# Patient Record
Sex: Male | Born: 1955 | Race: White | Hispanic: No | Marital: Married | Smoking: Former smoker
Health system: Southern US, Community
[De-identification: ages and names within clinical notes are randomized; demographics above are authoritative.]

## PROBLEM LIST (undated history)

## (undated) DIAGNOSIS — M1990 Unspecified osteoarthritis, unspecified site: Secondary | ICD-10-CM

## (undated) DIAGNOSIS — E785 Hyperlipidemia, unspecified: Secondary | ICD-10-CM

## (undated) DIAGNOSIS — R399 Unspecified symptoms and signs involving the genitourinary system: Secondary | ICD-10-CM

## (undated) DIAGNOSIS — Z7901 Long term (current) use of anticoagulants: Secondary | ICD-10-CM

## (undated) DIAGNOSIS — R351 Nocturia: Secondary | ICD-10-CM

## (undated) DIAGNOSIS — I255 Ischemic cardiomyopathy: Secondary | ICD-10-CM

## (undated) DIAGNOSIS — K219 Gastro-esophageal reflux disease without esophagitis: Secondary | ICD-10-CM

## (undated) DIAGNOSIS — I2510 Atherosclerotic heart disease of native coronary artery without angina pectoris: Secondary | ICD-10-CM

## (undated) DIAGNOSIS — N183 Chronic kidney disease, stage 3 unspecified: Secondary | ICD-10-CM

## (undated) DIAGNOSIS — E782 Mixed hyperlipidemia: Secondary | ICD-10-CM

## (undated) DIAGNOSIS — K08109 Complete loss of teeth, unspecified cause, unspecified class: Secondary | ICD-10-CM

## (undated) DIAGNOSIS — Z8711 Personal history of peptic ulcer disease: Secondary | ICD-10-CM

## (undated) DIAGNOSIS — Z972 Presence of dental prosthetic device (complete) (partial): Secondary | ICD-10-CM

## (undated) DIAGNOSIS — I10 Essential (primary) hypertension: Secondary | ICD-10-CM

## (undated) DIAGNOSIS — I213 ST elevation (STEMI) myocardial infarction of unspecified site: Secondary | ICD-10-CM

## (undated) DIAGNOSIS — C9110 Chronic lymphocytic leukemia of B-cell type not having achieved remission: Secondary | ICD-10-CM

## (undated) HISTORY — DX: ST elevation (STEMI) myocardial infarction of unspecified site: I21.3

## (undated) HISTORY — DX: Chronic lymphocytic leukemia of B-cell type not having achieved remission: C91.10

## (undated) HISTORY — DX: Hyperlipidemia, unspecified: E78.5

## (undated) HISTORY — DX: Ischemic cardiomyopathy: I25.5

## (undated) HISTORY — PX: TONSILLECTOMY: SUR1361

## (undated) HISTORY — DX: Atherosclerotic heart disease of native coronary artery without angina pectoris: I25.10

---

## 2008-08-07 ENCOUNTER — Ambulatory Visit: Payer: Self-pay | Admitting: Oncology

## 2008-08-08 ENCOUNTER — Other Ambulatory Visit: Admission: RE | Admit: 2008-08-08 | Discharge: 2008-08-08 | Payer: Self-pay | Admitting: Oncology

## 2008-08-08 ENCOUNTER — Encounter: Payer: Self-pay | Admitting: Oncology

## 2008-08-08 LAB — CMP (CANCER CENTER ONLY)
CO2: 27 mEq/L (ref 18–33)
Calcium: 9.9 mg/dL (ref 8.0–10.3)
Creat: 1.6 mg/dl — ABNORMAL HIGH (ref 0.6–1.2)
Glucose, Bld: 96 mg/dL (ref 73–118)
Total Bilirubin: 0.8 mg/dl (ref 0.20–1.60)

## 2008-08-08 LAB — MANUAL DIFFERENTIAL (CHCC SATELLITE)
ANC (CHCC HP manual diff): 2.5 10*3/uL (ref 1.5–6.5)
LYMPH: 87 % — ABNORMAL HIGH (ref 14–48)
PLT EST ~~LOC~~: ADEQUATE
Platelet Morphology: NORMAL
SEG: 6 % — ABNORMAL LOW (ref 40–75)

## 2008-08-08 LAB — CBC WITH DIFFERENTIAL (CANCER CENTER ONLY)
HCT: 48.7 % (ref 38.7–49.9)
HGB: 16.7 g/dL (ref 13.0–17.1)
MCH: 29.9 pg (ref 28.0–33.4)
MCV: 87 fL (ref 82–98)
RBC: 5.6 10*6/uL (ref 4.20–5.70)
WBC: 41.3 10*3/uL — ABNORMAL HIGH (ref 4.0–10.0)

## 2008-08-09 LAB — IGG, IGA, IGM
IgG (Immunoglobin G), Serum: 890 mg/dL (ref 694–1618)
IgM, Serum: 38 mg/dL — ABNORMAL LOW (ref 60–263)

## 2008-08-09 LAB — BETA 2 MICROGLOBULIN, SERUM: Beta-2 Microglobulin: 1.93 mg/L — ABNORMAL HIGH (ref 1.01–1.73)

## 2008-08-09 LAB — URIC ACID: Uric Acid, Serum: 8 mg/dL — ABNORMAL HIGH (ref 4.0–7.8)

## 2008-08-14 LAB — CBC WITH DIFFERENTIAL (CANCER CENTER ONLY)
BASO#: 1 10*3/uL — ABNORMAL HIGH (ref 0.0–0.2)
BASO%: 2.7 % — ABNORMAL HIGH (ref 0.0–2.0)
EOS%: 0.5 % (ref 0.0–7.0)
HCT: 46.1 % (ref 38.7–49.9)
HGB: 15.9 g/dL (ref 13.0–17.1)
LYMPH#: 32 10*3/uL — ABNORMAL HIGH (ref 0.9–3.3)
LYMPH%: 85.1 % — ABNORMAL HIGH (ref 14.0–48.0)
MCH: 29.8 pg (ref 28.0–33.4)
MCHC: 34.4 g/dL (ref 32.0–35.9)
MCV: 87 fL (ref 82–98)
NEUT%: 9.4 % — ABNORMAL LOW (ref 40.0–80.0)
RDW: 13 % (ref 10.5–14.6)

## 2008-08-19 LAB — FLOW CYTOMETRY - CHCC SATELLITE

## 2008-09-04 ENCOUNTER — Encounter: Payer: Self-pay | Admitting: Oncology

## 2008-09-04 ENCOUNTER — Other Ambulatory Visit: Admission: RE | Admit: 2008-09-04 | Discharge: 2008-09-04 | Payer: Self-pay | Admitting: Oncology

## 2008-09-04 LAB — MANUAL DIFFERENTIAL (CHCC SATELLITE)
ANC (CHCC HP manual diff): 4.5 10*3/uL (ref 1.5–6.5)
LYMPH: 81 % — ABNORMAL HIGH (ref 14–48)
MONO: 1 % (ref 0–13)
RBC Comments: NORMAL
SEG: 16 % — ABNORMAL LOW (ref 40–75)
Variant Lymph: 2 % — ABNORMAL HIGH (ref 0–0)

## 2008-09-04 LAB — CBC WITH DIFFERENTIAL (CANCER CENTER ONLY)
HCT: 44.3 % (ref 38.7–49.9)
HGB: 15.2 g/dL (ref 13.0–17.1)
MCHC: 34.4 g/dL (ref 32.0–35.9)
RBC: 5.07 10*6/uL (ref 4.20–5.70)
WBC: 28.1 10*3/uL — ABNORMAL HIGH (ref 4.0–10.0)

## 2008-09-13 LAB — ZAP-70 - CHCC SATELLITE

## 2008-10-17 ENCOUNTER — Ambulatory Visit: Payer: Self-pay | Admitting: Oncology

## 2008-10-21 LAB — CBC WITH DIFFERENTIAL (CANCER CENTER ONLY)
MCV: 87 fL (ref 82–98)
Platelets: 187 10*3/uL (ref 145–400)
RBC: 5.27 10*6/uL (ref 4.20–5.70)
RDW: 13 % (ref 10.5–14.6)
WBC: 36.4 10*3/uL — ABNORMAL HIGH (ref 4.0–10.0)

## 2008-10-21 LAB — MANUAL DIFFERENTIAL (CHCC SATELLITE)
ALC: 33.1 10*3/uL — ABNORMAL HIGH (ref 0.9–3.3)
ANC (CHCC HP manual diff): 2.9 10*3/uL (ref 1.5–6.5)
PLT EST ~~LOC~~: ADEQUATE
Platelet Morphology: NORMAL

## 2009-03-01 DIAGNOSIS — C9110 Chronic lymphocytic leukemia of B-cell type not having achieved remission: Secondary | ICD-10-CM

## 2009-03-01 HISTORY — DX: Chronic lymphocytic leukemia of B-cell type not having achieved remission: C91.10

## 2010-06-07 LAB — TISSUE HYBRIDIZATION (BONE MARROW)-NCBH

## 2010-06-07 LAB — BONE MARROW EXAM

## 2013-08-14 ENCOUNTER — Telehealth: Payer: Self-pay | Admitting: Hematology and Oncology

## 2013-08-14 NOTE — Telephone Encounter (Signed)
CALLED PATIENT TO SCHEDULE NP APPT PER PATIENT NOT ABLE TO SCHEDULE APPT DUE TO RETURNING BACK TO WORK AND WILL CALL BACK TO SCHEDULE.

## 2020-07-30 DIAGNOSIS — I2510 Atherosclerotic heart disease of native coronary artery without angina pectoris: Secondary | ICD-10-CM

## 2020-07-30 HISTORY — DX: Atherosclerotic heart disease of native coronary artery without angina pectoris: I25.10

## 2020-08-12 ENCOUNTER — Emergency Department (HOSPITAL_COMMUNITY): Payer: Managed Care, Other (non HMO)

## 2020-08-12 ENCOUNTER — Encounter (HOSPITAL_COMMUNITY)
Admission: EM | Disposition: A | Discharge status: Home / Self Care | Payer: Self-pay | Source: Home / Self Care | Attending: Cardiovascular Disease

## 2020-08-12 ENCOUNTER — Encounter (HOSPITAL_COMMUNITY): Payer: Self-pay

## 2020-08-12 ENCOUNTER — Inpatient Hospital Stay (HOSPITAL_COMMUNITY)
Admission: EM | Admit: 2020-08-12 | Discharge: 2020-08-14 | DRG: 246 | Disposition: A | Discharge status: Home / Self Care | Payer: Managed Care, Other (non HMO) | Attending: Cardiovascular Disease | Admitting: Cardiovascular Disease

## 2020-08-12 DIAGNOSIS — Z20822 Contact with and (suspected) exposure to covid-19: Secondary | ICD-10-CM | POA: Diagnosis present

## 2020-08-12 DIAGNOSIS — I10 Essential (primary) hypertension: Secondary | ICD-10-CM

## 2020-08-12 DIAGNOSIS — Z955 Presence of coronary angioplasty implant and graft: Secondary | ICD-10-CM

## 2020-08-12 DIAGNOSIS — E782 Mixed hyperlipidemia: Secondary | ICD-10-CM | POA: Diagnosis not present

## 2020-08-12 DIAGNOSIS — I110 Hypertensive heart disease with heart failure: Secondary | ICD-10-CM | POA: Diagnosis present

## 2020-08-12 DIAGNOSIS — I213 ST elevation (STEMI) myocardial infarction of unspecified site: Secondary | ICD-10-CM | POA: Diagnosis present

## 2020-08-12 DIAGNOSIS — I2510 Atherosclerotic heart disease of native coronary artery without angina pectoris: Secondary | ICD-10-CM | POA: Diagnosis not present

## 2020-08-12 DIAGNOSIS — I2102 ST elevation (STEMI) myocardial infarction involving left anterior descending coronary artery: Principal | ICD-10-CM | POA: Diagnosis present

## 2020-08-12 DIAGNOSIS — Z7982 Long term (current) use of aspirin: Secondary | ICD-10-CM | POA: Diagnosis not present

## 2020-08-12 DIAGNOSIS — C9110 Chronic lymphocytic leukemia of B-cell type not having achieved remission: Secondary | ICD-10-CM | POA: Diagnosis present

## 2020-08-12 DIAGNOSIS — I255 Ischemic cardiomyopathy: Secondary | ICD-10-CM | POA: Diagnosis present

## 2020-08-12 DIAGNOSIS — E785 Hyperlipidemia, unspecified: Secondary | ICD-10-CM | POA: Diagnosis present

## 2020-08-12 DIAGNOSIS — I252 Old myocardial infarction: Secondary | ICD-10-CM

## 2020-08-12 DIAGNOSIS — I5021 Acute systolic (congestive) heart failure: Secondary | ICD-10-CM | POA: Diagnosis present

## 2020-08-12 HISTORY — PX: LEFT HEART CATH AND CORONARY ANGIOGRAPHY: CATH118249

## 2020-08-12 HISTORY — PX: CORONARY/GRAFT ACUTE MI REVASCULARIZATION: CATH118305

## 2020-08-12 HISTORY — DX: Presence of coronary angioplasty implant and graft: Z95.5

## 2020-08-12 HISTORY — DX: Old myocardial infarction: I25.2

## 2020-08-12 LAB — BASIC METABOLIC PANEL
Anion gap: 11 (ref 5–15)
BUN: 21 mg/dL (ref 8–23)
CO2: 28 mmol/L (ref 22–32)
Calcium: 9.1 mg/dL (ref 8.9–10.3)
Chloride: 104 mmol/L (ref 98–111)
Creatinine, Ser: 1.75 mg/dL — ABNORMAL HIGH (ref 0.61–1.24)
GFR, Estimated: 42 mL/min — ABNORMAL LOW (ref >60)
Glucose, Bld: 132 mg/dL — ABNORMAL HIGH (ref 70–99)
Potassium: 4 mmol/L (ref 3.5–5.1)
Sodium: 143 mmol/L (ref 135–145)

## 2020-08-12 LAB — CBC
HCT: 51.5 % (ref 39.0–52.0)
Hemoglobin: 16.4 g/dL (ref 13.0–17.0)
MCH: 30.4 pg (ref 26.0–34.0)
MCHC: 31.8 g/dL (ref 30.0–36.0)
MCV: 95.5 fL (ref 80.0–100.0)
Platelets: 183 10*3/uL (ref 150–400)
RBC: 5.39 MIL/uL (ref 4.22–5.81)
RDW: 14.5 % (ref 11.5–15.5)
WBC: 49 10*3/uL — ABNORMAL HIGH (ref 4.0–10.5)
nRBC: 0 % (ref 0.0–0.2)

## 2020-08-12 LAB — LIPID PANEL
Cholesterol: 232 mg/dL — ABNORMAL HIGH (ref 0–200)
HDL: 41 mg/dL (ref >40)
LDL Cholesterol: 121 mg/dL — ABNORMAL HIGH (ref 0–99)
Total CHOL/HDL Ratio: 5.7 RATIO
Triglycerides: 350 mg/dL — ABNORMAL HIGH (ref <150)
VLDL: 70 mg/dL — ABNORMAL HIGH (ref 0–40)

## 2020-08-12 LAB — RESP PANEL BY RT-PCR (FLU A&B, COVID) ARPGX2
Influenza A by PCR: NEGATIVE
Influenza B by PCR: NEGATIVE
SARS Coronavirus 2 by RT PCR: NEGATIVE

## 2020-08-12 LAB — TROPONIN I (HIGH SENSITIVITY): Troponin I (High Sensitivity): 15 ng/L (ref <18)

## 2020-08-12 LAB — PROTIME-INR
INR: 1 (ref 0.8–1.2)
Prothrombin Time: 13 seconds (ref 11.4–15.2)

## 2020-08-12 LAB — APTT: aPTT: 27 seconds (ref 24–36)

## 2020-08-12 SURGERY — CORONARY/GRAFT ACUTE MI REVASCULARIZATION
Anesthesia: LOCAL

## 2020-08-12 MED ORDER — TIROFIBAN HCL IN NACL 5-0.9 MG/100ML-% IV SOLN
INTRAVENOUS | Status: AC | PRN
  Administered 2020-08-12: 0.075 ug/kg/min via INTRAVENOUS

## 2020-08-12 MED ORDER — FENTANYL CITRATE (PF) 100 MCG/2ML IJ SOLN
INTRAMUSCULAR | Status: DC | PRN
  Administered 2020-08-12: 25 ug via INTRAVENOUS

## 2020-08-12 MED ORDER — ATORVASTATIN CALCIUM 80 MG PO TABS
80.0000 mg | ORAL_TABLET | Freq: Every day | ORAL | Status: DC
  Administered 2020-08-13 – 2020-08-14 (×2): 80 mg via ORAL
  Filled 2020-08-12 (×2): qty 1

## 2020-08-12 MED ORDER — HEPARIN SODIUM (PORCINE) 1000 UNIT/ML IJ SOLN
INTRAMUSCULAR | Status: AC
  Filled 2020-08-12: qty 1

## 2020-08-12 MED ORDER — TIROFIBAN HCL IN NACL 5-0.9 MG/100ML-% IV SOLN: 0.0750 ug/kg/min | INTRAVENOUS | Status: DC

## 2020-08-12 MED ORDER — TIROFIBAN (AGGRASTAT) BOLUS VIA INFUSION
INTRAVENOUS | Status: DC | PRN
  Administered 2020-08-12: 2700 ug via INTRAVENOUS

## 2020-08-12 MED ORDER — VERAPAMIL HCL 2.5 MG/ML IV SOLN
INTRAVENOUS | Status: DC | PRN
  Administered 2020-08-12: 10 mL via INTRA_ARTERIAL

## 2020-08-12 MED ORDER — IOHEXOL 350 MG/ML SOLN
INTRAVENOUS | Status: DC | PRN
  Administered 2020-08-12: 70 mL

## 2020-08-12 MED ORDER — TIROFIBAN HCL IN NACL 5-0.9 MG/100ML-% IV SOLN
INTRAVENOUS | Status: DC | PRN
  Administered 2020-08-12: 0.15 ug/kg/min via INTRAVENOUS

## 2020-08-12 MED ORDER — HEPARIN (PORCINE) IN NACL 1000-0.9 UT/500ML-% IV SOLN
INTRAVENOUS | Status: DC | PRN
Start: 2020-08-12 — End: 2020-08-12
  Administered 2020-08-12 (×2): 500 mL

## 2020-08-12 MED ORDER — SODIUM CHLORIDE 0.9 % IV SOLN: INTRAVENOUS | Status: DC

## 2020-08-12 MED ORDER — TIROFIBAN HCL IN NACL 5-0.9 MG/100ML-% IV SOLN
0.0750 ug/kg/min | INTRAVENOUS | Status: DC
  Administered 2020-08-12: 0.075 ug/kg/min via INTRAVENOUS

## 2020-08-12 MED ORDER — VERAPAMIL HCL 2.5 MG/ML IV SOLN
INTRAVENOUS | Status: DC | PRN
  Administered 2020-08-12 (×2): 200 ug via INTRACORONARY

## 2020-08-12 MED ORDER — TICAGRELOR 90 MG PO TABS
ORAL_TABLET | ORAL | Status: DC | PRN
  Administered 2020-08-12: 180 mg via ORAL

## 2020-08-12 MED ORDER — TICAGRELOR 90 MG PO TABS
ORAL_TABLET | ORAL | Status: AC
  Filled 2020-08-12: qty 2

## 2020-08-12 MED ORDER — ASPIRIN 81 MG PO CHEW
324.0000 mg | CHEWABLE_TABLET | Freq: Once | ORAL | Status: AC
  Administered 2020-08-12: 324 mg via ORAL

## 2020-08-12 MED ORDER — METOPROLOL TARTRATE 5 MG/5ML IV SOLN
INTRAVENOUS | Status: AC
  Administered 2020-08-12: 5 mg via INTRAVENOUS
  Filled 2020-08-12: qty 5

## 2020-08-12 MED ORDER — LIDOCAINE HCL (PF) 1 % IJ SOLN
INTRAMUSCULAR | Status: AC
  Filled 2020-08-12: qty 30

## 2020-08-12 MED ORDER — MORPHINE SULFATE (PF) 4 MG/ML IV SOLN: 4.0000 mg | Freq: Once | INTRAVENOUS | Status: AC

## 2020-08-12 MED ORDER — NITROGLYCERIN 1 MG/10 ML FOR IR/CATH LAB
INTRA_ARTERIAL | Status: AC
  Filled 2020-08-12: qty 10

## 2020-08-12 MED ORDER — NITROGLYCERIN 0.4 MG SL SUBL: 0.4000 mg | SUBLINGUAL_TABLET | SUBLINGUAL | Status: DC | PRN

## 2020-08-12 MED ORDER — FENTANYL CITRATE (PF) 100 MCG/2ML IJ SOLN
INTRAMUSCULAR | Status: AC
  Filled 2020-08-12: qty 2

## 2020-08-12 MED ORDER — HEPARIN SODIUM (PORCINE) 1000 UNIT/ML IJ SOLN
INTRAMUSCULAR | Status: DC | PRN
  Administered 2020-08-12: 8000 [IU] via INTRAVENOUS

## 2020-08-12 MED ORDER — LIDOCAINE HCL (PF) 1 % IJ SOLN
INTRAMUSCULAR | Status: DC | PRN
  Administered 2020-08-12: 2 mL

## 2020-08-12 MED ORDER — NITROGLYCERIN 1 MG/10 ML FOR IR/CATH LAB
INTRA_ARTERIAL | Status: DC | PRN
  Administered 2020-08-12 (×2): 150 ug via INTRACORONARY

## 2020-08-12 MED ORDER — HEPARIN SODIUM (PORCINE) 5000 UNIT/ML IJ SOLN: 60.0000 [IU]/kg | Freq: Once | INTRAMUSCULAR | Status: AC

## 2020-08-12 MED ORDER — MORPHINE SULFATE (PF) 4 MG/ML IV SOLN
INTRAVENOUS | Status: AC
  Administered 2020-08-12: 4 mg via INTRAVENOUS
  Filled 2020-08-12: qty 1

## 2020-08-12 MED ORDER — METOPROLOL TARTRATE 5 MG/5ML IV SOLN: 5.0000 mg | Freq: Once | INTRAVENOUS | Status: AC

## 2020-08-12 MED ORDER — MIDAZOLAM HCL 2 MG/2ML IJ SOLN
INTRAMUSCULAR | Status: DC | PRN
  Administered 2020-08-12: 2 mg via INTRAVENOUS

## 2020-08-12 MED ORDER — ASPIRIN 81 MG PO CHEW
CHEWABLE_TABLET | ORAL | Status: AC
  Filled 2020-08-12: qty 4

## 2020-08-12 MED ORDER — TIROFIBAN HCL IN NACL 5-0.9 MG/100ML-% IV SOLN
INTRAVENOUS | Status: AC
  Filled 2020-08-12: qty 100

## 2020-08-12 MED ORDER — HEPARIN (PORCINE) IN NACL 1000-0.9 UT/500ML-% IV SOLN
INTRAVENOUS | Status: AC
  Filled 2020-08-12: qty 1000

## 2020-08-12 MED ORDER — CARVEDILOL 3.125 MG PO TABS
3.1250 mg | ORAL_TABLET | Freq: Two times a day (BID) | ORAL | Status: DC
  Administered 2020-08-13 – 2020-08-14 (×3): 3.125 mg via ORAL
  Filled 2020-08-12 (×3): qty 1

## 2020-08-12 MED ORDER — MIDAZOLAM HCL 2 MG/2ML IJ SOLN
INTRAMUSCULAR | Status: AC
  Filled 2020-08-12: qty 2

## 2020-08-12 SURGICAL SUPPLY — 16 items
BALLN SAPPHIRE 2.5X12 (BALLOONS) ×2
BALLOON SAPPHIRE 2.5X12 (BALLOONS) ×1 IMPLANT
CATH 5FR JL3.5 JR4 ANG PIG MP (CATHETERS) ×2 IMPLANT
CATH LAUNCHER 6FR EBU3.5 (CATHETERS) ×2 IMPLANT
DEVICE RAD COMP TR BAND LRG (VASCULAR PRODUCTS) ×2 IMPLANT
GLIDESHEATH SLEND SS 6F .021 (SHEATH) ×2 IMPLANT
GUIDEWIRE INQWIRE 1.5J.035X260 (WIRE) ×2 IMPLANT
INQWIRE 1.5J .035X260CM (WIRE) ×4
KIT ENCORE 26 ADVANTAGE (KITS) ×2 IMPLANT
KIT HEART LEFT (KITS) ×2 IMPLANT
PACK CARDIAC CATHETERIZATION (CUSTOM PROCEDURE TRAY) ×2 IMPLANT
STENT SYNERGY XD 4.0X16 (Permanent Stent) ×1 IMPLANT
SYNERGY XD 4.0X16 (Permanent Stent) ×2 IMPLANT
TRANSDUCER W/STOPCOCK (MISCELLANEOUS) ×2 IMPLANT
TUBING CIL FLEX 10 FLL-RA (TUBING) ×2 IMPLANT
WIRE COUGAR XT STRL 190CM (WIRE) ×2 IMPLANT

## 2020-08-12 NOTE — H&P (Signed)
Cardiology Admission History and Physical:   Patient ID: Walter Chapman MRN: [id number]; DOB: 11-07-1955   Admission date: 08/12/2020  PCP:  No primary care provider on file.   [facility] Providers Cardiologist:  None        Chief Complaint:  CP/STEMI  Patient Profile:   Walter Chapman is a 65 y.o. male with no prior cardiac hx who is being seen 08/12/2020 for the evaluation of STEMI.  History of Present Illness:   Walter Chapman has h/o HTN, dyslipidemia and CLL but no prior cardiac hx, comes in to the ED after a 1-day h/o intermittent chest discomfort and SOB.  He says he first noticed some discomfort in his chest about 2 days ago; that felt like a "fluttering." He noticed that a few times over the course of 2 days. Today, he had rather sudden onset of severe pressure in the center of his chest after dinner this evening. He thought it might be GERD and took some acid-relievers, but the discomfort was persistent. It had associated nausea and SOB. When the discomfort did not improve, he decided to call EMS to bring him to the ED. Upon arrival at the ED he was found to have ST elevation on EKG.   PMHx: HTN Dyslipidemia CLL  History reviewed. No pertinent surgical history.   Medications Prior to Admission: Prior to Admission medications   Not on File   No active OP meds  Allergies:   No Known Allergies  Social History:   No h/o tobacco use   Family History:  Pt's brother had CABG in his early 35s  ROS:  Please see the history of present illness.  All other ROS reviewed and negative.     Physical Exam/Data:   Vitals:   08/12/20 2105 08/12/20 2115 08/12/20 2129 08/12/20 2130  BP: (!) 166/112 (!) 175/118  (!) 159/117  Pulse: (!) 114 (!) 106  97  Resp: (!) 25 (!) 23  (!) 28  Temp:   98.5 F (36.9 C)   TempSrc:   Axillary   SpO2: 99% 98%  99%   No intake or output data in the 24 hours ending 08/12/20 2134 No flowsheet data found.   There is no height or  weight on file to calculate BMI.  General:  Well nourished, well developed, diaphoretic and in mild distress HEENT: normal Lymph: no adenopathy Neck: no JVD Endocrine:  No thryomegaly Vascular: No carotid bruits; DP pulses 2+ bilaterally   Cardiac:  normal S1, S2; RRR; no murmur  Lungs:  clear to auscultation bilaterally, no wheezing, rhonchi or rales  Abd: soft, nontender, no hepatomegaly  Ext: tr bilateral LE edema Musculoskeletal:  No deformities  Skin: warm, diaphoretic  Neuro:  no focal abnormalities noted Psych:  Normal affect    EKG:  The ECG that was done 08-12-20 was personally reviewed and demonstrates ST with ST elevation in lateral leads w/ reciprocal ST depression in the precordium  Relevant CV Studies: none  Laboratory Data:  High Sensitivity Troponin:  No results for input(s): TROPONINIHS in the last 720 hours.    ChemistryNo results for input(s): NA, K, CL, CO2, GLUCOSE, BUN, CREATININE, CALCIUM, GFRNONAA, GFRAA, ANIONGAP in the last 168 hours.  No results for input(s): PROT, ALBUMIN, AST, ALT, ALKPHOS, BILITOT in the last 168 hours. HematologyNo results for input(s): WBC, RBC, HGB, HCT, MCV, MCH, MCHC, RDW, PLT in the last 168 hours. BNPNo results for input(s): BNP, PROBNP in the last 168 hours.  DDimer  No results for input(s): DDIMER in the last 168 hours.   Radiology/Studies:  No results found.   Assessment and Plan:   STEMI: urgent LHC for further evaluation. Will get TTE for baseline. Get TSH, A1c, and FLP for risk stratification. HTN/dyslipidemia: restart home regimen and adjust as needed   Risk Assessment/Risk Scores:    TIMI Risk Score for ST  Elevation MI:   The patient's TIMI risk score is 2, which indicates a 2.2% risk of all cause mortality at 30 days.        Severity of Illness: The appropriate patient status for this patient is INPATIENT. Inpatient status is judged to be reasonable and necessary in order to provide the required intensity  of service to ensure the patient's safety. The patient's presenting symptoms, physical exam findings, and initial radiographic and laboratory data in the context of their chronic comorbidities is felt to place them at high risk for further clinical deterioration. Furthermore, it is not anticipated that the patient will be medically stable for discharge from the hospital within 2 midnights of admission. The following factors support the patient status of inpatient.   " The patient's presenting symptoms include CP, STEMI. " The worrisome physical exam findings include NA. " The initial radiographic and laboratory data are worrisome because of STEMI. " The chronic co-morbidities include CLL.   * I certify that at the point of admission it is my clinical judgment that the patient will require inpatient hospital care spanning beyond 2 midnights from the point of admission due to high intensity of service, high risk for further deterioration and high frequency of surveillance required.*   For questions or updates, please contact [place] Please consult [email or web address] for contact info under     Signed, Rudean Curt, MD, [facility] 08/12/2020 9:34 PM

## 2020-08-12 NOTE — ED Triage Notes (Signed)
Pt states that he has been having CP with SOB all day. Central, non radiating

## 2020-08-12 NOTE — ED Provider Notes (Signed)
[facility] EMERGENCY DEPARTMENT Provider Note   CSN: [id number] Arrival date & time: 08/12/20  2046     History Chief Complaint  Patient presents with   Chest Pain    Walter Chapman is a 65 y.o. male.   Chest Pain Pain location:  Substernal area Pain quality: dull and pressure   Pain radiates to:  L arm Pain severity:  Severe Onset quality:  Sudden Duration: this evening. Progression:  Worsening Chronicity:  New Relieved by:  Nothing Worsened by:  Nothing Associated symptoms: no abdominal pain, no fever and no vomiting   Risk factors: hypertension, male sex and obesity       History reviewed. No pertinent past medical history.  Patient Active Problem List   Diagnosis Date Noted   STEMI (ST elevation myocardial infarction) ([place]) 08/12/2020   STEMI involving left anterior descending coronary artery ([place]) 08/12/2020    History reviewed. No pertinent surgical history.     No family history on file.     Home Medications Prior to Admission medications   Not on File    Allergies    Patient has no known allergies.  Review of Systems   Review of Systems  Constitutional:  Negative for fever.  Cardiovascular:  Positive for chest pain.  Gastrointestinal:  Negative for abdominal pain and vomiting.  All other systems reviewed and are negative.  Physical Exam Updated Vital Signs BP (!) 159/117   Pulse 97   Temp 98.5 F (36.9 C) (Axillary)   Resp (!) 28   Ht 1.791 m (5' 10.5")   Wt 108 kg   SpO2 99%   BMI 33.67 kg/m   Physical Exam Vitals and nursing note reviewed.  Constitutional:      General: He is in acute distress.     Appearance: He is well-developed.  HENT:     Head: Normocephalic and atraumatic.     Right Ear: External ear normal.     Left Ear: External ear normal.  Eyes:     General: No scleral icterus.       Right eye: No discharge.        Left eye: No discharge.     Conjunctiva/sclera: Conjunctivae normal.   Neck:     Trachea: No tracheal deviation.  Cardiovascular:     Rate and Rhythm: Normal rate and regular rhythm.  Pulmonary:     Effort: Pulmonary effort is normal. No respiratory distress.     Breath sounds: Normal breath sounds. No stridor. No wheezing or rales.  Abdominal:     General: Bowel sounds are normal. There is no distension.     Palpations: Abdomen is soft.     Tenderness: There is no abdominal tenderness. There is no guarding or rebound.  Musculoskeletal:        General: No tenderness or deformity.     Cervical back: Neck supple.  Skin:    General: Skin is warm and dry.     Findings: No rash.  Neurological:     General: No focal deficit present.     Mental Status: He is alert.     Cranial Nerves: No cranial nerve deficit (no facial droop, extraocular movements intact, no slurred speech).     Sensory: No sensory deficit.     Motor: No abnormal muscle tone or seizure activity.     Coordination: Coordination normal.  Psychiatric:        Mood and Affect: Mood normal.    ED Results /  Procedures / Treatments   Labs (all labs ordered are listed, but only abnormal results are displayed) Labs Reviewed  BASIC METABOLIC PANEL - Abnormal; Notable for the following components:      Result Value   Glucose, Bld 132 (*)    Creatinine, Ser 1.75 (*)    GFR, Estimated 42 (*)    All other components within normal limits  CBC - Abnormal; Notable for the following components:   WBC 49.0 (*)    All other components within normal limits  LIPID PANEL - Abnormal; Notable for the following components:   Cholesterol 232 (*)    Triglycerides 350 (*)    VLDL 70 (*)    LDL Cholesterol 121 (*)    All other components within normal limits  RESP PANEL BY RT-PCR (FLU A&B, COVID) ARPGX2  MRSA NEXT GEN BY PCR, NASAL  PROTIME-INR  APTT  HEMOGLOBIN A1C  HEMOGLOBIN A1C  TSH  LIPID PANEL  TROPONIN I (HIGH SENSITIVITY)  TROPONIN I (HIGH SENSITIVITY)    EKG None  Radiology CARDIAC  CATHETERIZATION  Result Date: 08/12/2020 1.  Subtotal thrombotic occlusion of the proximal LAD, treated successfully with primary PCI using a 4.0 x 16 mm Synergy DES 2.  Moderate 50% mid left circumflex stenosis with TIMI-3 flow 3.  Widely patent, dominant RCA with no significant obstruction 4.  Elevated LVEDP consistent with acute systolic heart failure in the setting of acute myocardial infarction  DG Chest Port 1 View  Result Date: 08/12/2020 CLINICAL DATA:  65 year old male with chest pain. EXAM: PORTABLE CHEST 1 VIEW COMPARISON:  None. FINDINGS: Shallow inspiration. No focal consolidation, pleural effusion, pneumothorax. Cardiac silhouette is within limits. No acute osseous pathology. IMPRESSION: No active disease. Electronically Signed   By: Anner Crete M.D.   On: 08/12/2020 21:41    Procedures .Critical Care  Date/Time: 08/12/2020 11:44 PM Performed by: Dorie Rank, MD Authorized by: Dorie Rank, MD   Critical care provider statement:    Critical care time (minutes):  30   Critical care was time spent personally by me on the following activities:  Discussions with consultants, evaluation of patient's response to treatment, examination of patient, ordering and performing treatments and interventions, ordering and review of laboratory studies, ordering and review of radiographic studies, pulse oximetry, re-evaluation of patient's condition, obtaining history from patient or surrogate and review of old charts   Medications Ordered in ED Medications  0.9 %  sodium chloride infusion ( Intravenous New Bag/Given 08/12/20 2123)  nitroGLYCERIN (NITROSTAT) SL tablet 0.4 mg ( Sublingual MAR Unhold 08/12/20 2254)  tirofiban (AGGRASTAT) infusion 50 mcg/mL 100 mL (0.075 mcg/kg/min  108 kg Intravenous Handoff 08/12/20 2313)  atorvastatin (LIPITOR) tablet 80 mg (has no administration in time range)  carvedilol (COREG) tablet 3.125 mg (has no administration in time range)  aspirin chewable tablet 324  mg (324 mg Oral Given 08/12/20 2113)  heparin injection 60 Units/kg ( Intravenous Given 08/12/20 2113)  metoprolol tartrate (LOPRESSOR) injection 5 mg (5 mg Intravenous Given 08/12/20 2126)  morphine 4 MG/ML injection 4 mg (4 mg Intravenous Given 08/12/20 2127)  tirofiban (AGGRASTAT) infusion 50 mcg/mL 100 mL (0.075 mcg/kg/min  108 kg Intravenous New Bag/Given 08/12/20 2225)    ED Course  I have reviewed the triage vital signs and the nursing notes.  Pertinent labs & imaging results that were available during my care of the patient were reviewed by me and considered in my medical decision making (see chart for details).    MDM Rules/Calculators/A&P  Patient presented to the ED with complaints of acute chest pain.  Initial triage EKG was suggestive of an ST elevation myocardial infarction.  Code STEMI was activated.  Patient was treated with aspirin and heparin.  With his pain he was also given morphine.  Patient was tachycardic and hypertensive dose of metoprolol was also given.  Patient noted some improvement of his symptoms.  Patient was transported to the cardiac Cath Lab for further treatment Final Clinical Impression(s) / ED Diagnoses Final diagnoses:  STEMI (ST elevation myocardial infarction) ([place])     Dorie Rank, MD 08/12/20 2346

## 2020-08-13 ENCOUNTER — Other Ambulatory Visit: Payer: Self-pay

## 2020-08-13 ENCOUNTER — Encounter (HOSPITAL_COMMUNITY): Payer: Self-pay | Admitting: Cardiovascular Disease

## 2020-08-13 ENCOUNTER — Inpatient Hospital Stay (HOSPITAL_COMMUNITY): Payer: Managed Care, Other (non HMO)

## 2020-08-13 ENCOUNTER — Other Ambulatory Visit (HOSPITAL_COMMUNITY): Payer: Self-pay

## 2020-08-13 DIAGNOSIS — I2102 ST elevation (STEMI) myocardial infarction involving left anterior descending coronary artery: Secondary | ICD-10-CM

## 2020-08-13 LAB — LIPID PANEL
Cholesterol: 174 mg/dL (ref 0–200)
HDL: 39 mg/dL — ABNORMAL LOW (ref >40)
LDL Cholesterol: 104 mg/dL — ABNORMAL HIGH (ref 0–99)
Total CHOL/HDL Ratio: 4.5 RATIO
Triglycerides: 156 mg/dL — ABNORMAL HIGH (ref <150)
VLDL: 31 mg/dL (ref 0–40)

## 2020-08-13 LAB — POCT I-STAT, CHEM 8
BUN: 24 mg/dL — ABNORMAL HIGH (ref 8–23)
Calcium, Ion: 1.2 mmol/L (ref 1.15–1.40)
Chloride: 103 mmol/L (ref 98–111)
Creatinine, Ser: 1.5 mg/dL — ABNORMAL HIGH (ref 0.61–1.24)
Glucose, Bld: 130 mg/dL — ABNORMAL HIGH (ref 70–99)
HCT: 44 % (ref 39.0–52.0)
Hemoglobin: 15 g/dL (ref 13.0–17.0)
Potassium: 3.7 mmol/L (ref 3.5–5.1)
Sodium: 141 mmol/L (ref 135–145)
TCO2: 27 mmol/L (ref 22–32)

## 2020-08-13 LAB — MRSA NEXT GEN BY PCR, NASAL: MRSA by PCR Next Gen: NOT DETECTED

## 2020-08-13 LAB — HEMOGLOBIN A1C
Hgb A1c MFr Bld: 5.7 % — ABNORMAL HIGH (ref 4.8–5.6)
Hgb A1c MFr Bld: 5.8 % — ABNORMAL HIGH (ref 4.8–5.6)
Mean Plasma Glucose: 117 mg/dL
Mean Plasma Glucose: 120 mg/dL

## 2020-08-13 LAB — CBC
HCT: 43.4 % (ref 39.0–52.0)
Hemoglobin: 14.5 g/dL (ref 13.0–17.0)
MCH: 31.1 pg (ref 26.0–34.0)
MCHC: 33.4 g/dL (ref 30.0–36.0)
MCV: 93.1 fL (ref 80.0–100.0)
Platelets: 139 10*3/uL — ABNORMAL LOW (ref 150–400)
RBC: 4.66 MIL/uL (ref 4.22–5.81)
RDW: 14.5 % (ref 11.5–15.5)
WBC: 34.4 10*3/uL — ABNORMAL HIGH (ref 4.0–10.5)
nRBC: 0 % (ref 0.0–0.2)

## 2020-08-13 LAB — ECHOCARDIOGRAM COMPLETE
AV Mean grad: 2 mmHg
AV Peak grad: 3.4 mmHg
Ao pk vel: 0.92 m/s
Area-P 1/2: 3.63 cm2
Height: 70.5 in
Weight: 3908.31 oz

## 2020-08-13 LAB — POCT ACTIVATED CLOTTING TIME: Activated Clotting Time: 364 seconds

## 2020-08-13 LAB — TROPONIN I (HIGH SENSITIVITY): Troponin I (High Sensitivity): 14261 ng/L (ref <18)

## 2020-08-13 LAB — BASIC METABOLIC PANEL
Anion gap: 10 (ref 5–15)
BUN: 22 mg/dL (ref 8–23)
CO2: 24 mmol/L (ref 22–32)
Calcium: 8.6 mg/dL — ABNORMAL LOW (ref 8.9–10.3)
Chloride: 108 mmol/L (ref 98–111)
Creatinine, Ser: 1.55 mg/dL — ABNORMAL HIGH (ref 0.61–1.24)
GFR, Estimated: 49 mL/min — ABNORMAL LOW (ref >60)
Glucose, Bld: 110 mg/dL — ABNORMAL HIGH (ref 70–99)
Potassium: 4.5 mmol/L (ref 3.5–5.1)
Sodium: 142 mmol/L (ref 135–145)

## 2020-08-13 LAB — TSH: TSH: 1.747 u[IU]/mL (ref 0.350–4.500)

## 2020-08-13 MED ORDER — ACETAMINOPHEN 325 MG PO TABS: 650.0000 mg | ORAL_TABLET | ORAL | Status: DC | PRN

## 2020-08-13 MED ORDER — ENOXAPARIN SODIUM 40 MG/0.4ML IJ SOSY
40.0000 mg | PREFILLED_SYRINGE | INTRAMUSCULAR | Status: DC
  Administered 2020-08-13: 40 mg via SUBCUTANEOUS
  Filled 2020-08-13: qty 0.4

## 2020-08-13 MED ORDER — SODIUM CHLORIDE 0.9 % IV SOLN: INTRAVENOUS | Status: DC

## 2020-08-13 MED ORDER — SODIUM CHLORIDE 0.9% FLUSH
3.0000 mL | Freq: Two times a day (BID) | INTRAVENOUS | Status: DC
  Administered 2020-08-13 (×3): 3 mL via INTRAVENOUS

## 2020-08-13 MED ORDER — CHLORHEXIDINE GLUCONATE CLOTH 2 % EX PADS
6.0000 | MEDICATED_PAD | Freq: Every day | CUTANEOUS | Status: DC
  Administered 2020-08-13: 6 via TOPICAL

## 2020-08-13 MED ORDER — LABETALOL HCL 5 MG/ML IV SOLN: 10.0000 mg | INTRAVENOUS | Status: DC | PRN

## 2020-08-13 MED ORDER — ASPIRIN 81 MG PO CHEW
81.0000 mg | CHEWABLE_TABLET | Freq: Every day | ORAL | Status: DC
  Administered 2020-08-13 – 2020-08-14 (×2): 81 mg via ORAL
  Filled 2020-08-13 (×2): qty 1

## 2020-08-13 MED ORDER — PERFLUTREN LIPID MICROSPHERE
1.0000 mL | INTRAVENOUS | Status: AC | PRN
Start: 2020-08-13 — End: 2020-08-13
  Administered 2020-08-13: 2 mL via INTRAVENOUS
  Filled 2020-08-13: qty 10

## 2020-08-13 MED ORDER — OXYCODONE HCL 5 MG PO TABS: 5.0000 mg | ORAL_TABLET | ORAL | Status: DC | PRN

## 2020-08-13 MED ORDER — HYDRALAZINE HCL 20 MG/ML IJ SOLN: 10.0000 mg | INTRAMUSCULAR | Status: DC | PRN

## 2020-08-13 MED ORDER — TICAGRELOR 90 MG PO TABS
90.0000 mg | ORAL_TABLET | Freq: Two times a day (BID) | ORAL | Status: DC
  Administered 2020-08-13 – 2020-08-14 (×3): 90 mg via ORAL
  Filled 2020-08-13 (×3): qty 1

## 2020-08-13 MED ORDER — SODIUM CHLORIDE 0.9 % IV SOLN: 250.0000 mL | INTRAVENOUS | Status: DC | PRN

## 2020-08-13 MED ORDER — ONDANSETRON HCL 4 MG/2ML IJ SOLN: 4.0000 mg | Freq: Four times a day (QID) | INTRAMUSCULAR | Status: DC | PRN

## 2020-08-13 MED ORDER — MORPHINE SULFATE (PF) 2 MG/ML IV SOLN: 2.0000 mg | INTRAVENOUS | Status: DC | PRN

## 2020-08-13 MED ORDER — SODIUM CHLORIDE 0.9% FLUSH: 3.0000 mL | INTRAVENOUS | Status: DC | PRN

## 2020-08-13 NOTE — Progress Notes (Signed)
Progress Note  Patient Name: Walter Chapman Date of Encounter: 08/13/2020  [facility] Cardiologist: Dr. Sherren Mocha  Subjective   No chest pain or shortness of breath this morning.  Postop day 1 anterior STEMI status post intervention.  Inpatient Medications    Scheduled Meds:  aspirin  81 mg Oral Daily   atorvastatin  80 mg Oral Daily   carvedilol  3.125 mg Oral BID WC   sodium chloride flush  3 mL Intravenous Q12H   ticagrelor  90 mg Oral BID   Continuous Infusions:  sodium chloride Stopped (08/13/20 0800)   sodium chloride     PRN Meds: sodium chloride, acetaminophen, morphine injection, nitroGLYCERIN, ondansetron (ZOFRAN) IV, oxyCODONE, sodium chloride flush   Vital Signs    Vitals:   08/13/20 0700 08/13/20 0758 08/13/20 0800 08/13/20 0900  BP: 128/84  127/76 122/74  Pulse: 68  64 62  Resp: 14  17 12   Temp:  97.9 F (36.6 C)    TempSrc:  Oral    SpO2: 96%  96% 97%  Weight:      Height:        Intake/Output Summary (Last 24 hours) at 08/13/2020 0935 Last data filed at 08/13/2020 0800 Gross per 24 hour  Intake 802.16 ml  Output 800 ml  Net 2.16 ml   Last 3 Weights 08/13/2020 08/12/2020  Weight (lbs) 244 lb 4.3 oz 238 lb  Weight (kg) 110.8 kg 107.956 kg      Telemetry    Sinus rhythm with PVCs- Personally Reviewed  ECG    Normal sinus rhythm at 68 with lateral T wave inversion- Personally Reviewed  Physical Exam   GEN: No acute distress.   Neck: No JVD Cardiac: RRR, no murmurs, rubs, or gallops.  Respiratory: Clear to auscultation bilaterally. GI: Soft, nontender, non-distended  MS: No edema; No deformity. Neuro:  Nonfocal  Psych: Normal affect   Labs    High Sensitivity Troponin:   Recent Labs  Lab 08/12/20 2057 08/12/20 2345  TROPONINIHS 15 14,261*      Chemistry Recent Labs  Lab 08/12/20 2057 08/13/20 0145  NA 143 142  K 4.0 4.5  CL 104 108  CO2 28 24  GLUCOSE 132* 110*  BUN 21 22  CREATININE 1.75* 1.55*   CALCIUM 9.1 8.6*  GFRNONAA 42* 49*  ANIONGAP 11 10     Hematology Recent Labs  Lab 08/12/20 2057 08/13/20 0145  WBC 49.0* 34.4*  RBC 5.39 4.66  HGB 16.4 14.5  HCT 51.5 43.4  MCV 95.5 93.1  MCH 30.4 31.1  MCHC 31.8 33.4  RDW 14.5 14.5  PLT 183 139*    BNPNo results for input(s): BNP, PROBNP in the last 168 hours.   DDimer No results for input(s): DDIMER in the last 168 hours.   Radiology    CARDIAC CATHETERIZATION  Result Date: 08/12/2020 1.  Subtotal thrombotic occlusion of the proximal LAD, treated successfully with primary PCI using a 4.0 x 16 mm Synergy DES 2.  Moderate 50% mid left circumflex stenosis with TIMI-3 flow 3.  Widely patent, dominant RCA with no significant obstruction 4.  Elevated LVEDP consistent with acute systolic heart failure in the setting of acute myocardial infarction  DG Chest Port 1 View  Result Date: 08/12/2020 CLINICAL DATA:  65 year old male with chest pain. EXAM: PORTABLE CHEST 1 VIEW COMPARISON:  None. FINDINGS: Shallow inspiration. No focal consolidation, pleural effusion, pneumothorax. Cardiac silhouette is within limits. No acute osseous pathology. IMPRESSION: No active disease.  Electronically Signed   By: Anner Crete M.D.   On: 08/12/2020 21:41    Cardiac Studies   Cardiac catheterization/PCI and stent (08/12/2020)  Conclusion  1.  Subtotal thrombotic occlusion of the proximal LAD, treated successfully with primary PCI using a 4.0 x 16 mm Synergy DES 2.  Moderate 50% mid left circumflex stenosis with TIMI-3 flow 3.  Widely patent, dominant RCA with no significant obstruction 4.  Elevated LVEDP consistent with acute systolic heart failure in the setting of acute myocardial infarction Coronary Diagrams   Diagnostic Dominance: Right    Intervention     Patient Profile     65 y.o. male without prior cardiac history who noticed some fluttering over the last week, chest pain yesterday and presented with anterior STEMI.   He underwent PCI and drug-eluting stenting by Dr. Burt Knack successfully.  Assessment & Plan    1: Anterior STEMI-postop day 1 anterior STEMI with occluded proximal LAD status post stenting with a 4 mm x 16 mm long Synergy drug-eluting stent with excellent flow.  2D echo is pending.  He is on aspirin and Brilinta which she will need to be on uninterrupted for 12 months.  He is also on a beta-blocker.  2: Hyperlipidemia-high-dose statin started  Clinically stable, transfer to telemetry, anticipate discharge home in the next 24 to 48 hours.  Cardiac rehab to see.  For questions or updates, please contact [place] Please consult [email or web address] for contact info under        Signed, Quay Burow, MD  08/13/2020, 9:35 AM

## 2020-08-13 NOTE — Progress Notes (Signed)
CARDIAC REHAB PHASE I   PRE:  Rate/Rhythm: 13 SR  BP:  Sitting: 127/79      SaO2: 97 RA  MODE:  Ambulation: 740 ft   POST:  Rate/Rhythm: 90 SR  BP:  Sitting: 128/79    SaO2: 98 RA   Pt ambulated 758ft in hallway independently with steady gait. Pt denies CP, SOB, or dizziness. Pt educated on importance of ASA, Brilinta, statin, and NTG. Pt given MI book, stent card, and heart healthy diet. Reviewed site care, restrictions, and exercise guidelines. Will refer to CRP II [place]. Will continue to follow.  [id number] Rufina Falco, RN BSN 08/13/2020 2:53 PM

## 2020-08-13 NOTE — TOC Benefit Eligibility Note (Signed)
Patient Teacher, English as a foreign language completed.    The patient is currently admitted and upon discharge could be taking Brilinta 90 mg.  The current 30 day co-pay is, $25.00.   The patient is insured through [place] [place] Patient Advocate Specialist [place] Team Direct Number: [phone]  Fax: [phone]

## 2020-08-13 NOTE — Progress Notes (Signed)
*  PRELIMINARY RESULTS* Echocardiogram 2D Echocardiogram has been performed with Def[facility].  Luisa Hart RDCS 08/13/2020, 10:09 AM

## 2020-08-14 ENCOUNTER — Other Ambulatory Visit (HOSPITAL_COMMUNITY): Payer: Self-pay

## 2020-08-14 DIAGNOSIS — E782 Mixed hyperlipidemia: Secondary | ICD-10-CM

## 2020-08-14 DIAGNOSIS — I2102 ST elevation (STEMI) myocardial infarction involving left anterior descending coronary artery: Secondary | ICD-10-CM | POA: Diagnosis not present

## 2020-08-14 DIAGNOSIS — I255 Ischemic cardiomyopathy: Secondary | ICD-10-CM

## 2020-08-14 DIAGNOSIS — I10 Essential (primary) hypertension: Secondary | ICD-10-CM

## 2020-08-14 DIAGNOSIS — E785 Hyperlipidemia, unspecified: Secondary | ICD-10-CM

## 2020-08-14 DIAGNOSIS — C9110 Chronic lymphocytic leukemia of B-cell type not having achieved remission: Secondary | ICD-10-CM

## 2020-08-14 MED ORDER — ATORVASTATIN CALCIUM 80 MG PO TABS
80.0000 mg | ORAL_TABLET | Freq: Every day | ORAL | 1 refills | Status: DC
  Filled 2020-08-14: qty 30, 30d supply, fill #0

## 2020-08-14 MED ORDER — TICAGRELOR 90 MG PO TABS
90.0000 mg | ORAL_TABLET | Freq: Two times a day (BID) | ORAL | 2 refills | Status: DC
  Filled 2020-08-14: qty 60, 30d supply, fill #0

## 2020-08-14 MED ORDER — SACUBITRIL-VALSARTAN 24-26 MG PO TABS
1.0000 | ORAL_TABLET | Freq: Two times a day (BID) | ORAL | 2 refills | Status: DC
  Filled 2020-08-14: qty 60, 30d supply, fill #0

## 2020-08-14 MED ORDER — DAPAGLIFLOZIN PROPANEDIOL 10 MG PO TABS
10.0000 mg | ORAL_TABLET | Freq: Every day | ORAL | Status: DC
  Administered 2020-08-14: 10 mg via ORAL
  Filled 2020-08-14: qty 1

## 2020-08-14 MED ORDER — SACUBITRIL-VALSARTAN 24-26 MG PO TABS
1.0000 | ORAL_TABLET | Freq: Two times a day (BID) | ORAL | Status: DC
  Administered 2020-08-14: 1 via ORAL
  Filled 2020-08-14: qty 1

## 2020-08-14 MED ORDER — DAPAGLIFLOZIN PROPANEDIOL 10 MG PO TABS
10.0000 mg | ORAL_TABLET | Freq: Every day | ORAL | 0 refills | Status: DC
  Filled 2020-08-14: qty 30, 30d supply, fill #0

## 2020-08-14 MED ORDER — NITROGLYCERIN 0.4 MG SL SUBL
0.4000 mg | SUBLINGUAL_TABLET | SUBLINGUAL | 2 refills | Status: DC | PRN
  Filled 2020-08-14: qty 25, 7d supply, fill #0

## 2020-08-14 MED ORDER — CARVEDILOL 3.125 MG PO TABS
3.1250 mg | ORAL_TABLET | Freq: Two times a day (BID) | ORAL | 0 refills | Status: DC
  Filled 2020-08-14: qty 60, 30d supply, fill #0

## 2020-08-14 MED ORDER — ASPIRIN 81 MG PO CHEW
81.0000 mg | CHEWABLE_TABLET | Freq: Every day | ORAL | 2 refills | Status: AC
  Filled 2020-08-14: qty 30, 30d supply, fill #0

## 2020-08-14 NOTE — Progress Notes (Signed)
CARDIAC REHAB PHASE I   Went to offer to walk with pt. Pt states independent ambulation without CP, SOB, or dizziness. Reinforced MI education. Pt and family deny questions or concerns at this time. Referred to CRP II [place].  [id number] Rufina Falco, RN BSN 08/14/2020 11:28 AM

## 2020-08-14 NOTE — TOC Benefit Eligibility Note (Signed)
Patient Research scientist (life sciences) completed.     The patient is currently admitted and upon discharge could be taking Entresto 24mg /26mg .   The current 30 day co-pay is, $50.00.  The patient is currently admitted and upon discharge could be taking Farxiga 10mg .   The current 30 day co-pay is, $15.00.   The patient is insured through [employer] [place] Patient Advocate Specialist [place] Team Direct Number: [phone]  Fax: [phone]

## 2020-08-14 NOTE — Progress Notes (Signed)
D/C instructions given and reviewed. Tele and IV removed, tolerated well. Notified us pt was ready to transport [facility].

## 2020-08-14 NOTE — Plan of Care (Signed)

## 2020-08-14 NOTE — Discharge Summary (Addendum)
Discharge Summary    Patient ID: Walter Chapman MRN: [id number]; DOB: February 22, 1956  Admit date: 08/12/2020 Discharge date: 08/14/2020  PCP:  [facility] [place] [facility] Cardiologist:  Sherren Mocha, MD     Discharge Diagnoses    Principal Problem:   STEMI (ST elevation myocardial infarction) [facility]) Active Problems:   STEMI involving left anterior descending coronary artery ([place])   Ischemic cardiomyopathy   Hyperlipidemia   Hypertension   CLL (chronic lymphocytic leukemia) ([place])   Diagnostic Studies/Procedures    Cath: 08/12/20  1.  Subtotal thrombotic occlusion of the proximal LAD, treated successfully with primary PCI using a 4.0 x 16 mm Synergy DES 2.  Moderate 50% mid left circumflex stenosis with TIMI-3 flow 3.  Widely patent, dominant RCA with no significant obstruction 4.  Elevated LVEDP consistent with acute systolic heart failure in the setting of acute myocardial infarction  Diagnostic Dominance: Right    Intervention    Echo: 08/13/20  IMPRESSIONS     1. Left ventricular ejection fraction, by estimation, is 35 to 40%. The  left ventricle has moderately decreased function. The left ventricle  demonstrates regional wall motion abnormalities (suggestive of LAD  disease). Left ventricular diastolic  parameters are consistent with Grade I diastolic dysfunction (impaired  relaxation). No evidence of LV thrombus/   2. Right ventricular systolic function is normal. The right ventricular  size is normal. Tricuspid regurgitation signal is inadequate for assessing  PA pressure.   3. The mitral valve is normal in structure. No evidence of mitral valve  regurgitation.   4. The aortic valve is grossly normal. Aortic valve regurgitation is not  visualized. No aortic stenosis is present.   Comparison(s): No prior Echocardiogram.   _____________   History of Present Illness     Walter Chapman is a 65 y.o. male with PMH of HTN,  HLD and CLL but no prior cardiac history. Presented to the ED after a 1-day h/o intermittent chest discomfort and SOB. He said he first noticed some discomfort in his chest about 2 days  prior to admission; that felt like a "fluttering." He noticed that a few times over the course of 2 days. The day of admission, he had rather sudden onset of severe pressure in the center of his chest after dinner this evening. He thought it might be GERD and took some acid-relievers, but the discomfort was persistent. It had associated nausea and SOB. When the discomfort did not improve, he decided to call EMS to bring him to the ED. Upon arrival at the ED he was found to have ST elevation on EKG.   Hospital Course    STEMI: Underwent cardiac catheterization noted above with occluded proximal LAD s/p PCI/DES x1.  Troponin peaked at 14,261.  Recommendations for DAPT with aspirin/Brilinta for at least 1 year.  Worked well with cardiac rehab without recurrent chest pain.  ICM: Echocardiogram showed EF of 35 to 40% with regional wall motion abnormalities in the LAD territory, grade 1 diastolic dysfunction.  No evidence of LV thrombus.  Started on Coreg 3.125 mg twice daily along with Entresto 24/26 twice daily.  Recommend further titration of medication as an outpatient as tolerated.  Some volume overload prior to discharge.  Did not meet criteria for LifeVest prior to discharge given EF on echo. --[name] started prior to discharge  HLD: LDL 104 --Placed on high-dose statin --Needs FLP/LFTs in 8 weeks  HTN: Stable with Coreg 3.125 mg twice daily with  addition of Entresto 24/26 twice daily  CLL: Reports condition is stable, has essentially been asymptomatic since diagnosis.  General: Well developed, well nourished, male appearing in no acute distress. Head: Normocephalic, atraumatic.  Neck: Supple without bruits, JVD. Lungs:  Resp regular and unlabored, CTA. Heart: RRR, S1, S2, no S3, S4, or murmur; no  rub. Abdomen: Soft, non-tender, non-distended with normoactive bowel sounds. No hepatomegaly. No rebound/guarding. No obvious abdominal masses. Extremities: No clubbing, cyanosis, edema. Distal pedal pulses are 2+ bilaterally.  Neuro: Alert and oriented X 3. Moves all extremities spontaneously. Psych: Normal affect.  Was seen by Dr. Gwenlyn Found and deemed stable for discharge.  Follow-up in the office has been arranged.  Medications sent to [place] prior to discharge.  Educated by [employer].D.  Did the patient have an acute coronary syndrome (MI, NSTEMI, STEMI, etc) this admission?:  Yes                               AHA/ACC Clinical Performance & Quality Measures: Aspirin prescribed? - Yes ADP Receptor Inhibitor (Plavix/Clopidogrel, Brilinta/Ticagrelor or Effient/Prasugrel) prescribed (includes medically managed patients)? - Yes Beta Blocker prescribed? - Yes High Intensity Statin (Lipitor 40-80mg  or Crestor 20-40mg ) prescribed? - Yes EF assessed during THIS hospitalization? - Yes For EF <40%, was ACEI/ARB prescribed? - Yes For EF <40%, Aldosterone Antagonist (Spironolactone or Eplerenone) prescribed? - Not Applicable (EF >/= 91%) Cardiac Rehab Phase II ordered (including medically managed patients)? - Yes      _____________  Discharge Vitals Blood pressure (!) 134/92, pulse 77, temperature 98.9 F (37.2 C), temperature source Oral, resp. rate 18, height 5' 10.5" (1.791 m), weight 111.8 kg, SpO2 98 %.  Filed Weights   08/12/20 2130 08/13/20 0119 08/14/20 0416  Weight: 108 kg 110.8 kg 111.8 kg    Labs & Radiologic Studies    CBC Recent Labs    08/12/20 2057 08/12/20 2158 08/13/20 0145  WBC 49.0*  --  34.4*  HGB 16.4 15.0 14.5  HCT 51.5 44.0 43.4  MCV 95.5  --  93.1  PLT 183  --  638*   Basic Metabolic Panel Recent Labs    08/12/20 2057 08/12/20 2158 08/13/20 0145  NA 143 141 142  K 4.0 3.7 4.5  CL 104 103 108  CO2 28  --  24  GLUCOSE 132* 130* 110*  BUN 21 24* 22   CREATININE 1.75* 1.50* 1.55*  CALCIUM 9.1  --  8.6*   Liver Function Tests No results for input(s): AST, ALT, ALKPHOS, BILITOT, PROT, ALBUMIN in the last 72 hours. No results for input(s): LIPASE, AMYLASE in the last 72 hours. High Sensitivity Troponin:   Recent Labs  Lab 08/12/20 2057 08/12/20 2345  TROPONINIHS 15 14,261*    BNP Invalid input(s): POCBNP D-Dimer No results for input(s): DDIMER in the last 72 hours. Hemoglobin A1C Recent Labs    08/12/20 2345  HGBA1C 5.8*   Fasting Lipid Panel Recent Labs    08/13/20 0145  CHOL 174  HDL 39*  LDLCALC 104*  TRIG 156*  CHOLHDL 4.5   Thyroid Function Tests Recent Labs    08/12/20 2345  TSH 1.747   _____________  CARDIAC CATHETERIZATION  Result Date: 08/12/2020 1.  Subtotal thrombotic occlusion of the proximal LAD, treated successfully with primary PCI using a 4.0 x 16 mm Synergy DES 2.  Moderate 50% mid left circumflex stenosis with TIMI-3 flow 3.  Widely patent, dominant RCA with no  significant obstruction 4.  Elevated LVEDP consistent with acute systolic heart failure in the setting of acute myocardial infarction  DG Chest Port 1 View  Result Date: 08/12/2020 CLINICAL DATA:  65 year old male with chest pain. EXAM: PORTABLE CHEST 1 VIEW COMPARISON:  None. FINDINGS: Shallow inspiration. No focal consolidation, pleural effusion, pneumothorax. Cardiac silhouette is within limits. No acute osseous pathology. IMPRESSION: No active disease. Electronically Signed   By: Anner Crete M.D.   On: 08/12/2020 21:41   ECHOCARDIOGRAM COMPLETE  Result Date: 08/13/2020    ECHOCARDIOGRAM REPORT   Patient Name:   Walter Chapman Date of Exam: 08/13/2020 Medical Rec #:  [id number]         Height:       70.5 in Accession #:    [id number]        Weight:       244.3 lb Date of Birth:  1955/10/26          BSA:          2.284 m Patient Age:    32 years          BP:           128/84 mmHg Patient Gender: M                 HR:           61  bpm. Exam Location:  Inpatient Procedure: 2D Echo, Cardiac Doppler, Color Doppler and Intracardiac            Opacification Agent Indications:    Acute MI  History:        Patient has no prior history of Echocardiogram examinations.                 Previous Myocardial Infarction. 08/12/20 stent.  Sonographer:    Luisa Hart RDCS Referring Phys: 4 MICHAEL COOPER  Sonographer Comments: Technically challenging study due to limited acoustic windows, no parasternal window and patient is morbidly obese. Image acquisition challenging due to patient body habitus. IMPRESSIONS  1. Left ventricular ejection fraction, by estimation, is 35 to 40%. The left ventricle has moderately decreased function. The left ventricle demonstrates regional wall motion abnormalities (suggestive of LAD disease). Left ventricular diastolic parameters are consistent with Grade I diastolic dysfunction (impaired relaxation). No evidence of LV thrombus/  2. Right ventricular systolic function is normal. The right ventricular size is normal. Tricuspid regurgitation signal is inadequate for assessing PA pressure.  3. The mitral valve is normal in structure. No evidence of mitral valve regurgitation.  4. The aortic valve is grossly normal. Aortic valve regurgitation is not visualized. No aortic stenosis is present. Comparison(s): No prior Echocardiogram. FINDINGS  Left Ventricle: Left ventricular ejection fraction, by estimation, is 35 to 40%. The left ventricle has moderately decreased function. The left ventricle demonstrates regional wall motion abnormalities. Definity contrast agent was given IV to delineate the left ventricular endocardial borders. The left ventricular internal cavity size was normal in size. There is no left ventricular hypertrophy. Left ventricular diastolic parameters are consistent with Grade I diastolic dysfunction (impaired relaxation).  LV Wall Scoring: The mid and distal anterior wall, mid and distal anterior septum,  apical lateral segment, mid inferoseptal segment, and apex are hypokinetic. Right Ventricle: The right ventricular size is normal. No increase in right ventricular wall thickness. Right ventricular systolic function is normal. Tricuspid regurgitation signal is inadequate for assessing PA pressure. Left Atrium: Left atrial size was normal in size. Right Atrium: Right atrial  size was normal in size. Pericardium: There is no evidence of pericardial effusion. Mitral Valve: The mitral valve is normal in structure. No evidence of mitral valve regurgitation. Tricuspid Valve: The tricuspid valve is normal in structure. Tricuspid valve regurgitation is not demonstrated. Aortic Valve: The aortic valve is grossly normal. Aortic valve regurgitation is not visualized. No aortic stenosis is present. Aortic valve mean gradient measures 2.0 mmHg. Aortic valve peak gradient measures 3.4 mmHg. Pulmonic Valve: The pulmonic valve was not assessed. Pulmonic valve regurgitation is not visualized. Aorta: The aortic root was not well visualized. IAS/Shunts: The atrial septum is grossly normal.   Diastology LV e' medial:    6.64 cm/s LV E/e' medial:  8.5 LV e' lateral:   8.70 cm/s LV E/e' lateral: 6.5  RIGHT VENTRICLE RV Basal diam:  3.70 cm RV Mid diam:    2.90 cm RV S prime:     11.50 cm/s TAPSE (M-mode): 2.1 cm LEFT ATRIUM             Index       RIGHT ATRIUM           Index LA Vol (A2C):   61.1 ml 26.75 ml/m RA Area:     11.40 cm LA Vol (A4C):   51.2 ml 22.41 ml/m RA Volume:   23.50 ml  10.29 ml/m LA Biplane Vol: 58.4 ml 25.56 ml/m  AORTIC VALVE AV Vmax:           92.30 cm/s AV Vmean:          61.500 cm/s AV VTI:            0.199 m AV Peak Grad:      3.4 mmHg AV Mean Grad:      2.0 mmHg LVOT Vmax:         91.30 cm/s LVOT Vmean:        57.900 cm/s LVOT VTI:          0.189 m LVOT/AV VTI ratio: 0.95 MITRAL VALVE MV Area (PHT): 3.63 cm    SHUNTS MV Decel Time: 209 msec    Systemic VTI: 0.19 m MV E velocity: 56.40 cm/s MV A velocity:  45.70 cm/s MV E/A ratio:  1.23 Rudean Haskell MD Electronically signed by Rudean Haskell MD Signature Date/Time: 08/13/2020/11:49:47 AM    Final    Disposition   Pt is being discharged home today in good condition.  Follow-up Plans & Appointments     Follow-up Information     Tommie Raymond, NP Follow up on 09/03/2020.   Specialty: Cardiology Why: Please arrive 15 minutes early for your 3:15pm post-hospital follow-up appointment Contact information: [address] [place] [address] [phone]         [facility] [place] on 09/02/2020.   Specialty: Rheumatology Why: @11 :Max Sane information: [place] [address] [phone]         Sherren Mocha, MD .   Specialty: Cardiology Contact information: [address] [place] [address] [phone]                Discharge Instructions     Amb Referral to Cardiac Rehabilitation   Complete by: As directed    Diagnosis:  Coronary Stents STEMI     After initial evaluation and assessments completed: Virtual Based Care may be provided alone or in conjunction with Phase 2 Cardiac Rehab based on patient barriers.: Yes   Call MD for:  difficulty breathing,  headache or visual disturbances   Complete by: As directed    Call MD for:  persistant dizziness or light-headedness   Complete by: As directed    Call MD for:  redness, tenderness, or signs of infection (pain, swelling, redness, odor or green/yellow discharge around incision site)   Complete by: As directed    Diet - low sodium heart healthy   Complete by: As directed    Discharge instructions   Complete by: As directed    Radial Site Care Refer to this sheet in the next few weeks. These instructions provide you with information on caring for yourself after your procedure. Your caregiver may also give you more specific instructions. Your treatment has been planned according to  current medical practices, but problems sometimes occur. Call your caregiver if you have any problems or questions after your procedure. HOME CARE INSTRUCTIONS You may shower the day after the procedure. Remove the bandage (dressing) and gently wash the site with plain soap and water. Gently pat the site dry.  Do not apply powder or lotion to the site.  Do not submerge the affected site in water for 3 to 5 days.  Inspect the site at least twice daily.  Do not flex or bend the affected arm for 24 hours.  No lifting over 5 pounds (2.3 kg) for 5 days after your procedure.  Do not drive home if you are discharged the same day of the procedure. Have someone else drive you.  You may drive 24 hours after the procedure unless otherwise instructed by your caregiver.  What to expect: Any bruising will usually fade within 1 to 2 weeks.  Blood that collects in the tissue (hematoma) may be painful to the touch. It should usually decrease in size and tenderness within 1 to 2 weeks.  SEEK IMMEDIATE MEDICAL CARE IF: You have unusual pain at the radial site.  You have redness, warmth, swelling, or pain at the radial site.  You have drainage (other than a small amount of blood on the dressing).  You have chills.  You have a fever or persistent symptoms for more than 72 hours.  You have a fever and your symptoms suddenly get worse.  Your arm becomes pale, cool, tingly, or numb.  You have heavy bleeding from the site. Hold pressure on the site.   PLEASE DO NOT MISS ANY DOSES OF YOUR BRILINTA!!!!! Also keep a log of you blood pressures and bring back to your follow up appt. Please call the office with any questions.   Patients taking blood thinners should generally stay away from medicines like ibuprofen, Advil, Motrin, naproxen, and Aleve due to risk of stomach bleeding. You may take Tylenol as directed or talk to your primary doctor about alternatives.  PLEASE ENSURE THAT YOU DO NOT RUN OUT OF YOUR  BRILINTA/PLAVIX. This medication is very important to remain on for at least one year. IF you have issues obtaining this medication due to cost please CALL the office 3-5 business days prior to running out in order to prevent missing doses of this medication.   Increase activity slowly   Complete by: As directed       Discharge Medications   Allergies as of 08/14/2020   No Known Allergies      Medication List     STOP taking these medications    doxycycline 100 MG capsule Commonly known as: VIBRAMYCIN       TAKE these medications    Aspirin Low  Dose 81 MG chewable tablet Generic drug: aspirin Chew 1 tablet (81 mg total) by mouth daily. Start taking on: August 15, 2020   atorvastatin 80 MG tablet Commonly known as: LIPITOR Take 1 tablet (80 mg total) by mouth daily. Start taking on: August 15, 2020   Brilinta 90 MG Tabs tablet Generic drug: ticagrelor Take 1 tablet (90 mg total) by mouth 2 (two) times daily.   carvedilol 3.125 MG tablet Commonly known as: COREG Take 1 tablet (3.125 mg total) by mouth 2 (two) times daily with a meal.   Entresto 24-26 MG Generic drug: sacubitril-valsartan Take 1 tablet by mouth 2 (two) times daily.   Farxiga 10 MG Tabs tablet Generic drug: dapagliflozin propanediol Take 1 tablet (10 mg total) by mouth daily.   guaiFENesin 600 MG 12 hr tablet Commonly known as: MUCINEX 2 tablet as needed   multivitamin Tabs tablet Take 1 tablet by mouth daily with breakfast.   nitroGLYCERIN 0.4 MG SL tablet Commonly known as: NITROSTAT Place 1 tablet (0.4 mg total) under the tongue every 5 (five) minutes x 3 doses as needed for chest pain.   Zegerid OTC 20-1100 MG Caps capsule Generic drug: Omeprazole-Sodium Bicarbonate Take 1 capsule by mouth daily as needed (for indigestion).        Outstanding Labs/Studies   FLP/LFTs in 8 weeks  Duration of Discharge Encounter   Greater than 30 minutes including physician time.  Signed, Reino Bellis, NP 08/14/2020, 12:04 PM  Agree with note by Reino Bellis NP-C  POD # 3 Ant STEMI Rx with PCI/DES. Progressing well. No CP. On approp meds. Stable for DC home. DAPT. TOC7 then ROV with Dr Burt Knack  Lorretta Harp, M.D., [place], [facility], [place] [address] [place], [place]  [address]  [phone] 08/14/2020 5:25 PM

## 2020-08-14 NOTE — TOC Transition Note (Signed)
Transition of Care [facility]) - CM/SW Discharge Note   Patient Details  Name: Walter Chapman MRN: [id number] Date of Birth: 03/11/1955  [facility] CM/SW Contact:  Zenon Mayo, RN Phone Number: 08/14/2020, 12:04 PM   Clinical Narrative:    NCM spoke with patient at the bedside about his brilinta and co pay amts,  NCM gave him the brilinta 10.00 copay card , the farxiga 0 co pay card and the entresto 10.00 copay card.         Patient Goals and CMS Choice        Discharge Placement                       Discharge Plan and Services                                     Social Determinants of Health (SDOH) Interventions     Readmission Risk Interventions No flowsheet data found.

## 2020-08-21 ENCOUNTER — Telehealth (HOSPITAL_COMMUNITY): Payer: Self-pay

## 2020-08-21 NOTE — Telephone Encounter (Signed)
Called pt to see if he was interested in the cardiac rehab program, pt seem like he is not interested pt stated that he will call back if he is interested in the virtual cardiac rehab. Closed referral.

## 2020-08-21 NOTE — Telephone Encounter (Signed)
Pt insurance is active and benefits verified through [employer] Co-pay 0, DED $1,000/$1,000 met, out of pocket $2,500/$1,249.30 met, co-insurance 10%. no pre-authorization required. Passport, 08/21/2020@10 :16am, REF# [id number]   Will contact patient to see if he is interested in the Cardiac Rehab Program. If interested, patient will need to complete follow up appt. Once completed, patient will be contacted for scheduling upon review by the RN Navigator.

## 2020-08-22 ENCOUNTER — Telehealth: Payer: Self-pay | Admitting: Cardiovascular Disease

## 2020-08-22 NOTE — Telephone Encounter (Signed)
Patient would like to know if it is okay for him to return to work tomorrow. 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 Patient states that he is feeling good. He states that his work is okay with him returning and he is comfortable with returning. Advised that Dr. Burt Knack is out of the office today, but if he needs a note clearing him to give [place] a call back and we can get that to him upon Dr. Fara Boros return.

## 2020-08-22 NOTE — Telephone Encounter (Signed)
Patient called and stated that he desparately need for Dr. Burt Knack or nurse to give him a call asap regarding his job he starts tomorrow.

## 2020-08-26 ENCOUNTER — Telehealth (HOSPITAL_COMMUNITY): Payer: Self-pay | Admitting: Pharmacist

## 2020-08-26 ENCOUNTER — Other Ambulatory Visit (HOSPITAL_COMMUNITY): Payer: Self-pay

## 2020-08-26 NOTE — Telephone Encounter (Signed)
Pharmacy Transitions of Care Follow-up Telephone Call  Date of discharge: 08/14/20  Discharge Diagnosis: STEMI w/DES  How have you been since you were released from the hospital?  No issues, going ok, monitoring weight. 2 liter per day liquid restriction.  Walking sometimes for up to 20min/day.  Pt reports some dizziness/light headedness especially when going from sitting to standing.  I have asked him to speak with cardiologist about this as it could be a blood pressure issue.   Medication changes made at discharge:  - START:  Aspirin Low Dose (aspirin)  atorvastatin (LIPITOR)  Brilinta (ticagrelor)  carvedilol (COREG)  Entresto (sacubitril-valsartan)  Farxiga (dapagliflozin propanediol)  nitroGLYCERIN (NITROSTAT)    - STOPPED: doxycycline  - CHANGED: n/a  Medication changes verified by the patient? yes    Medication Accessibility:  Home Pharmacy:  [address]   Was the patient provided with refills on discharged medications? yes   Have all prescriptions been transferred from [facility] to home pharmacy?  yes  Is the patient able to afford medications? Yes Notable copays: Entresto ($50), Brilinta ($25), Farxiga ($15) Eligible patient assistance: Yes, copay cards available, Entresto ($10/up to 90d), Farxiga ($0, call for savings card [phone]), Brilinta ($5/30d)    Medication Review:  TICAGRELOR (BRILINTA) Ticagrelor 90 mg BID initiated on 08/14/20.  - Educated patient on expected duration of therapy of at least one year of aspirin with ticagrelor. Advised patient that dose of ticagrelor may be reduced after 1 year. Aspirin will likely be continued. - Discussed importance of taking medication around the same time every day, - Reviewed potential DDIs with patient - Advised patient of medications to avoid (NSAIDs, aspirin maintenance doses>100 mg daily) - Educated that Tylenol (acetaminophen) will be the preferred analgesic to prevent risk of bleeding  -  Emphasized importance of monitoring for signs and symptoms of bleeding (abnormal bruising, prolonged bleeding, nose bleeds, bleeding from gums, discolored urine, black tarry stools)  - Educated patient to notify doctor if shortness of breath or abnormal heartbeat occur - Advised patient to alert all providers of antiplatelet therapy prior to starting a new medication or having a procedure    Follow-up Appointments:  PCP Hospital f/u appt confirmed? No PCP scheduled at this time [facility] f/u appt confirmed? yes Scheduled to see Kathyrn Drown, cardiology on 09/03/20 @ 3:15.   Referred to cardiac rehab-not interested in this but plans to continue daily exercises at home including walking/treadmill/elliptical type machine.  I have asked him to discuss the benefits of cardiac rehab at his upcoming appointment and make a final decision at that time.   If their condition worsens, is the pt aware to call PCP or go to the Emergency Dept.? yes  Final Patient Assessment: It was a pleasure speaking with this patient.  He is well informed about his medication regimen and demonstrates compliance and understanding.

## 2020-08-31 NOTE — Progress Notes (Signed)
Cardiology Office Note   Date:  09/03/2020   ID:  Walter Chapman, DOB 25-Sep-1955, MRN [id number]  PCP:  [facility]  Cardiologist: Dr. Gwenlyn Found, MD   Chief Complaint  Patient presents with   Follow-up    History of Present Illness: Walter Chapman is a 65 y.o. male who presents for post hospital follow-up, seen for Dr. Burt Knack.  [name] has a hx of HTN, HLD and CLL who presented to the ED after a 1 day history of intermittent, progressive chest pain and shortness of breath.  He initially felt his symptoms may be related to GERD therefore he took antacids relievers however the discomfort persisted.  On ED presentation, EKG showed ST elevation therefore he was taken emergently to the cardiac Cath Lab which showed an occluded proximal LAD now status post PCI/DES x1.  Troponin peaked at 14,261.  He was placed on DAPT with ASA and Brilinta with plans for uninterrupted therapy for at least 1 year.  Echocardiogram showed an LVEF at 35 to 40% with regional wall motion abnormalities in the apical LAD territory with grade 1 DD.  There was no evidence of LV thrombus.  He was started on carvedilol 3.125 mg along with Delene Loll 24/26 twice daily with plans for further titration in the outpatient setting.  [name] was started prior to discharge.  He was placed on high-dose atorvastatin  Today he is here for follow up and is doing very well from a CV standpoint. He denies chest pain or other anginal symptoms. He was seen by his PCP yesterday with labs that showed a creatinine at 1.6 which is above his baseline which is typically in the normal range. He was placed on Entresto in the hospital, so likely coming from this. He otherwise is tolerating ASA and Brilinta with no issues. He has been increasing his exercise and is also tolerating this well.   History reviewed. No pertinent past medical history.  Past Surgical History:  Procedure Laterality Date   CORONARY/GRAFT ACUTE MI  REVASCULARIZATION N/A 08/12/2020   Procedure: Coronary/Graft Acute MI Revascularization;  Surgeon: Sherren Mocha, MD;  Location: [place] [facility];  Service: Cardiovascular;  Laterality: N/A;   LEFT HEART CATH AND CORONARY ANGIOGRAPHY N/A 08/12/2020   Procedure: LEFT HEART CATH AND CORONARY ANGIOGRAPHY;  Surgeon: Sherren Mocha, MD;  Location: [place] [facility];  Service: Cardiovascular;  Laterality: N/A;     Current Outpatient Medications  Medication Sig Dispense Refill   aspirin 81 MG chewable tablet Chew 1 tablet (81 mg total) by mouth daily. 90 tablet 2   atorvastatin (LIPITOR) 80 MG tablet Take 1 tablet (80 mg total) by mouth daily. 90 tablet 1   carvedilol (COREG) 6.25 MG tablet Take 1 tablet (6.25 mg total) by mouth 2 (two) times daily. 180 tablet 3   dapagliflozin propanediol (FARXIGA) 10 MG TABS tablet Take 1 tablet (10 mg total) by mouth daily. 90 tablet 0   multivitamin (ONE-A-DAY MEN'S) TABS tablet Take 1 tablet by mouth daily with breakfast.     nitroGLYCERIN (NITROSTAT) 0.4 MG SL tablet Place 1 tablet (0.4 mg total) under the tongue every 5 (five) minutes x 3 doses as needed for chest pain. 25 tablet 2   ticagrelor (BRILINTA) 90 MG TABS tablet Take 1 tablet (90 mg total) by mouth 2 (two) times daily. 180 tablet 2   ZEGERID OTC 20-1100 MG CAPS capsule Take 1 capsule by mouth daily as needed (for indigestion).  guaiFENesin (MUCINEX) 600 MG 12 hr tablet 2 tablet as needed (Patient not taking: Reported on 09/03/2020)     No current facility-administered medications for this visit.    Allergies:   Patient has no known allergies.    Social History:  The patient  reports that he has quit smoking. His smoking use included cigarettes. He has never used smokeless tobacco.   Family History:  The patient's family history is not on file.    ROS:  Please see the history of present illness. Otherwise, review of systems are positive for none.   All other systems are reviewed and  negative.    PHYSICAL EXAM: VS:  BP 132/82   Pulse 67   Ht 5\' 10"  (1.778 m)   Wt 235 lb 3.2 oz (106.7 kg)   SpO2 96%   BMI 33.75 kg/m  , BMI Body mass index is 33.75 kg/m.  General: Well developed, well nourished, NAD Skin: Warm, dry, intact  Head: Normocephalic, atraumatic, sclera non-icteric, no xanthomas, clear, moist mucus membranes. Neck: Negative for carotid bruits. No JVD Lungs:Clear to ausculation bilaterally. No wheezes, rales, or rhonchi. Breathing is unlabored. Cardiovascular: RRR with S1 S2. No murmurs, rubs, gallops, or LV heave appreciated. Abdomen: Soft, non-tender, non-distended with normoactive bowel sounds. No hepatomegaly, No rebound/guarding. No obvious abdominal masses. MSK: Strength and tone appear normal for age. 5/5 in all extremities Extremities: No edema. No clubbing or cyanosis. DP/PT pulses 2+ bilaterally Neuro: Alert and oriented. No focal deficits. No facial asymmetry. MAE spontaneously. Psych: Responds to questions appropriately with normal affect.     EKG:  EKG is ordered today. The ekg ordered today demonstrates NSR with TWI in lead I, avL, HR 67bpm    Recent Labs: 08/12/2020: TSH 1.747 08/13/2020: BUN 22; Creatinine, Ser 1.55; Hemoglobin 14.5; Platelets 139; Potassium 4.5; Sodium 142    Lipid Panel    Component Value Date/Time   CHOL 174 08/13/2020 0145   TRIG 156 (H) 08/13/2020 0145   HDL 39 (L) 08/13/2020 0145   CHOLHDL 4.5 08/13/2020 0145   VLDL 31 08/13/2020 0145   LDLCALC 104 (H) 08/13/2020 0145    Wt Readings from Last 3 Encounters:  09/03/20 235 lb 3.2 oz (106.7 kg)  08/14/20 246 lb 8 oz (111.8 kg)     Other studies Reviewed: Additional studies/ records that were reviewed today include: . Review of the above records demonstrates:   Cath: 08/12/20   1.  Subtotal thrombotic occlusion of the proximal LAD, treated successfully with primary PCI using a 4.0 x 16 mm Synergy DES 2.  Moderate 50% mid left circumflex stenosis with  TIMI-3 flow 3.  Widely patent, dominant RCA with no significant obstruction 4.  Elevated LVEDP consistent with acute systolic heart failure in the setting of acute myocardial infarction   Diagnostic Dominance: Right      Intervention      Echo: 08/13/20   IMPRESSIONS     1. Left ventricular ejection fraction, by estimation, is 35 to 40%. The  left ventricle has moderately decreased function. The left ventricle  demonstrates regional wall motion abnormalities (suggestive of LAD  disease). Left ventricular diastolic  parameters are consistent with Grade I diastolic dysfunction (impaired  relaxation). No evidence of LV thrombus/   2. Right ventricular systolic function is normal. The right ventricular  size is normal. Tricuspid regurgitation signal is inadequate for assessing  PA pressure.   3. The mitral valve is normal in structure. No evidence of mitral valve  regurgitation.  4. The aortic valve is grossly normal. Aortic valve regurgitation is not  visualized. No aortic stenosis is present.   Comparison(s): No prior Echocardiogram.  ASSESSMENT AND PLAN:  1. CAD s/p STEMI: -Underwent LHC found to have occluded proximal LAD s/p PCI/DES x1.  -Troponin peaked at 14,261.   -EKG stable today  -Continue DAPT with aspirin/Brilinta for at least 1 year.   -Continue statin -Denies recurrent chest pain    2. ICM:  -Echocardiogram showed EF of 35 to 40% with regional wall motion abnormalities in the LAD territory, grade 1 diastolic dysfunction>>no evidence of LV thrombus.  S -Strarted on low dose Entresto 24/26 in hospital however had follow up labs with PCP yesterday that showed creatinine at 1.69 -Stop Entrresto and repeat labs in 2 weeks -Appears euvolemic on exam today  -No need for Lasix at this time -Continue Farxiga started    3. HLD:  -Last LDL 104 -Continue high-dose statin -Repeat lipid panel at next OV    4. HTN:  -Stable, 132/82 -Will increase carvedilol to  6.25 today given the discontinuation of Entresto    5. CLL:  -Reports condition is stable, has essentially been asymptomatic since diagnosis.  6. AKI: -Creatinine, 1.69 from labs yesterday  -Stop Entresto>>>repeat labs in 2 weeks -May consider hydralazine/Imdur    Current medicines are reviewed at length with the patient today.  The patient does not have concerns regarding medicines.  The following changes have been made:  stop Entresto and increase carvedilol at 6.25  Labs/ tests ordered today include: BMET in two weeks   Orders Placed This Encounter  Procedures   Basic metabolic panel   EKG [id number]   Disposition:   FU with Cecilie Kicks in 6 weeks  Signed, Kathyrn Drown, NP  09/03/2020 3:57 PM    [facility] [place], [place], [place]  [address] Phone: [phone]; Fax: [phone]

## 2020-09-02 ENCOUNTER — Telehealth: Payer: Self-pay | Admitting: Cardiology

## 2020-09-02 NOTE — Telephone Encounter (Signed)
Judeth Porch a case Freight forwarder with [employer] is calling to inform our office the pt declined case management saying he had no needs. Reports she is closing the case due to this and patient will not be contacted any further in regards to it.

## 2020-09-02 NOTE — Telephone Encounter (Signed)
Pt has upcoming appt with Sharee Pimple.  Will forward to her for her knowledge.

## 2020-09-03 ENCOUNTER — Encounter: Payer: Self-pay | Admitting: Cardiology

## 2020-09-03 ENCOUNTER — Other Ambulatory Visit: Payer: Self-pay | Admitting: *Deleted

## 2020-09-03 ENCOUNTER — Other Ambulatory Visit: Payer: Self-pay

## 2020-09-03 ENCOUNTER — Ambulatory Visit: Payer: Managed Care, Other (non HMO) | Admitting: Cardiology

## 2020-09-03 VITALS — BP 132/82 | HR 67 | Ht 70.0 in | Wt 235.2 lb

## 2020-09-03 DIAGNOSIS — I10 Essential (primary) hypertension: Secondary | ICD-10-CM | POA: Diagnosis not present

## 2020-09-03 DIAGNOSIS — N179 Acute kidney failure, unspecified: Secondary | ICD-10-CM

## 2020-09-03 DIAGNOSIS — E782 Mixed hyperlipidemia: Secondary | ICD-10-CM | POA: Diagnosis not present

## 2020-09-03 DIAGNOSIS — I2102 ST elevation (STEMI) myocardial infarction involving left anterior descending coronary artery: Secondary | ICD-10-CM

## 2020-09-03 DIAGNOSIS — I255 Ischemic cardiomyopathy: Secondary | ICD-10-CM

## 2020-09-03 MED ORDER — CARVEDILOL 6.25 MG PO TABS: 6.2500 mg | ORAL_TABLET | Freq: Two times a day (BID) | ORAL | 3 refills | Status: DC

## 2020-09-03 NOTE — Patient Instructions (Addendum)
Medication Instructions:  Your physician has recommended you make the following change in your medication:   STOP Entresto  INCREASE Carvedilol to 6.25 taking 1 tablet twice a day    *If you need a refill on your cardiac medications before your next appointment, please call your pharmacy*   Lab Work: [facility] BMET  If you have labs (blood work) drawn today and your tests are completely normal, you will receive your results only by: [facility] (if you have MyChart) OR A paper copy in the mail If you have any lab test that is abnormal or we need to change your treatment, we will call you to review the results.   Testing/Procedures: None ordered   Follow-Up: At [facility] you and your health needs are our priority.  As part of our continuing mission to provide you with exceptional heart care, we have created designated Provider Care Teams.  These Care Teams include your primary Cardiologist (physician) and Advanced Practice Providers (APPs -  Physician Assistants and Nurse Practitioners) who all work together to provide you with the care you need, when you need it.  We recommend signing up for the patient portal called "MyChart".  Sign up information is provided on this After Visit Summary.  MyChart is used to connect with patients for Virtual Visits (Telemedicine).  Patients are able to view lab/test results, encounter notes, upcoming appointments, etc.  Non-urgent messages can be sent to your provider as well.   To learn more about what you can do with MyChart, go to [email or web address].    Your next appointment:   6 WEEKS   10/16/2020 ARRIVE AT 11:00   The format for your next appointment:   In Person  Provider:   You may see Quay Burow, MD or one of the following Advanced Practice Providers on your designated Care Team:     Other Instructions

## 2020-09-09 ENCOUNTER — Telehealth: Payer: Self-pay | Admitting: Cardiology

## 2020-09-09 ENCOUNTER — Other Ambulatory Visit: Payer: Self-pay | Admitting: *Deleted

## 2020-09-09 MED ORDER — DAPAGLIFLOZIN PROPANEDIOL 10 MG PO TABS
10.0000 mg | ORAL_TABLET | Freq: Every day | ORAL | 3 refills | Status: DC
Start: 2020-09-09 — End: 2020-12-10

## 2020-09-09 NOTE — Telephone Encounter (Signed)
*  STAT* If patient is at the pharmacy, call can be transferred to refill team.   1. Which medications need to be refilled? (please list name of each medication and dose if known) dapagliflozin propanediol (FARXIGA) 10 MG TABS tablet  2. Which pharmacy/location (including street and city if local pharmacy) is medication to be sent to? [facility] [phone] - [place], [address] AT [address]  3. Do they need a 30 day or 90 day supply? 90 day supply   Pt is out of this medication

## 2020-09-09 NOTE — Telephone Encounter (Signed)
Rx has been sent in as requested. Confirmation received. 

## 2020-09-17 LAB — BASIC METABOLIC PANEL
BUN/Creatinine Ratio: 10 (ref 10–24)
BUN: 17 mg/dL (ref 8–27)
CO2: 21 mmol/L (ref 20–29)
Calcium: 9.2 mg/dL (ref 8.6–10.2)
Chloride: 107 mmol/L — ABNORMAL HIGH (ref 96–106)
Creatinine, Ser: 1.65 mg/dL — ABNORMAL HIGH (ref 0.76–1.27)
Glucose: 86 mg/dL (ref 65–99)
Potassium: 4.9 mmol/L (ref 3.5–5.2)
Sodium: 141 mmol/L (ref 134–144)
eGFR: 46 mL/min/{1.73_m2} — ABNORMAL LOW (ref >59)

## 2020-10-15 NOTE — Progress Notes (Addendum)
Cardiology Office Note   Date:  10/16/2020   ID:  Walter Chapman, DOB 23-Apr-1955, MRN [id number]   PCP:  [facility]  Cardiologist:    Dr. Burt Knack   Chief Complaint  Patient presents with   Coronary Artery Disease      History of Present Illness: Walter Chapman is a 65 y.o. male who presents for CAD and MI, he has a  hx of HTN, HLD and CLL who presented to the ED after a 1 day history of intermittent, progressive chest pain and shortness of breath.  He initially felt his symptoms may be related to GERD therefore he took antacids relievers however the discomfort persisted.  On ED presentation, EKG showed ST elevation therefore he was taken emergently to the cardiac Cath Lab which showed an occluded proximal LAD now status post PCI/DES x1.  Troponin peaked at 14,261.  He was placed on DAPT with ASA and Brilinta with plans for uninterrupted therapy for at least 1 year.  Echocardiogram showed an LVEF at 35 to 40% with regional wall motion abnormalities in the apical LAD territory with grade 1 DD.  There was no evidence of LV thrombus.  He was started on carvedilol 3.125 mg along with Delene Loll 24/26 twice daily with plans for further titration in the outpatient setting.  [name] was started prior to discharge.  He was placed on high-dose atorvastatin   Last visit 09/03/20 he is here for follow up and is doing very well from a CV standpoint. He denies chest pain or other anginal symptoms. He was seen by his PCP yesterday with labs that showed a creatinine at 1.6 which is above his baseline which is typically in the normal range. He was placed on Entresto in the hospital, so likely coming from this. He otherwise is tolerating ASA and Brilinta with no issues. He has been increasing his exercise and is also tolerating this well.   Entresto stopped due to elevated Cr  and feeling terrible, weakness.  Increased coreg   Today he feels much better.  No chest pain and no SOB.  He  is exercising on treadmill without issues, does not plan to attend cardiac rehab.  No issues with Brilinta except some bruising.  Platelets in June were 139   Past Medical History:  Diagnosis Date   CAD in native artery    Cardiomyopathy, ischemic    CLL (chronic lymphocytic leukemia) (HCC)    HLD (hyperlipidemia)    STEMI (ST elevation myocardial infarction) ([place]) PCI to LAD     Past Surgical History:  Procedure Laterality Date   CORONARY/GRAFT ACUTE MI REVASCULARIZATION N/A 08/12/2020   Procedure: Coronary/Graft Acute MI Revascularization;  Surgeon: Sherren Mocha, MD;  Location: [place] [facility];  Service: Cardiovascular;  Laterality: N/A;   LEFT HEART CATH AND CORONARY ANGIOGRAPHY N/A 08/12/2020   Procedure: LEFT HEART CATH AND CORONARY ANGIOGRAPHY;  Surgeon: Sherren Mocha, MD;  Location: [place];  Service: Cardiovascular;  Laterality: N/A;     Current Outpatient Medications  Medication Sig Dispense Refill   aspirin 81 MG chewable tablet Chew 1 tablet (81 mg total) by mouth daily. 90 tablet 2   atorvastatin (LIPITOR) 80 MG tablet Take 1 tablet (80 mg total) by mouth daily. 90 tablet 1   carvedilol (COREG) 6.25 MG tablet Take 1 tablet (6.25 mg total) by mouth 2 (two) times daily. 180 tablet 3   dapagliflozin propanediol (FARXIGA) 10 MG TABS tablet Take 1 tablet (10 mg  total) by mouth daily. 90 tablet 3   multivitamin (ONE-A-DAY MEN'S) TABS tablet Take 1 tablet by mouth daily with breakfast.     nitroGLYCERIN (NITROSTAT) 0.4 MG SL tablet Place 1 tablet (0.4 mg total) under the tongue every 5 (five) minutes x 3 doses as needed for chest pain. 25 tablet 2   ticagrelor (BRILINTA) 90 MG TABS tablet Take 1 tablet (90 mg total) by mouth 2 (two) times daily. 180 tablet 2   ZEGERID OTC 20-1100 MG CAPS capsule Take 1 capsule by mouth daily as needed (for indigestion).     guaiFENesin (MUCINEX) 600 MG 12 hr tablet  (Patient not taking: Reported on 10/16/2020)     No current  facility-administered medications for this visit.    Allergies:   Patient has no known allergies.    Social History:  The patient  reports that he has quit smoking. His smoking use included cigarettes. He has never used smokeless tobacco.   Family History:  The patient's family history includes Coronary artery disease (age of onset: 49) in his brother.    ROS:  General:no colds or fevers, no weight changes Skin:no rashes or ulcers HEENT:no blurred vision, no congestion CV:see HPI PUL:see HPI GI:no diarrhea constipation or melena, no indigestion GU:no hematuria, no dysuria MS:no joint pain, no claudication Neuro:no syncope, no lightheadedness Endo:no diabetes, no thyroid disease  Wt Readings from Last 3 Encounters:  10/16/20 229 lb (103.9 kg)  09/03/20 235 lb 3.2 oz (106.7 kg)  08/14/20 246 lb 8 oz (111.8 kg)     PHYSICAL EXAM: VS:  BP 130/72   Pulse 80   Ht '5\' 10"'$  (1.778 m)   Wt 229 lb (103.9 kg)   SpO2 98%   BMI 32.86 kg/m  , BMI Body mass index is 32.86 kg/m. General:Pleasant affect, NAD Skin:Warm and dry, brisk capillary refill HEENT:normocephalic, sclera clear, mucus membranes moist Neck:supple, no JVD, no bruits  Heart:S1S2 RRR without murmur, gallup, rub or click Lungs:clear without rales, rhonchi, or wheezes [id number], non tender, + BS, do not palpate liver spleen or masses Ext:no lower ext edema, 2+ pedal pulses, 2+ radial pulses Neuro:alert and oriented X 3, MAE, follows commands, + facial symmetry    EKG:  EKG is NOT ordered today.   Recent Labs: 08/12/2020: TSH 1.747 08/13/2020: Hemoglobin 14.5; Platelets 139 09/16/2020: BUN 17; Creatinine, Ser 1.65; Potassium 4.9; Sodium 141    Lipid Panel    Component Value Date/Time   CHOL 174 08/13/2020 0145   TRIG 156 (H) 08/13/2020 0145   HDL 39 (L) 08/13/2020 0145   CHOLHDL 4.5 08/13/2020 0145   VLDL 31 08/13/2020 0145   LDLCALC 104 (H) 08/13/2020 0145       Other studies Reviewed: Additional  studies/ records that were reviewed today include: . Cath: 08/12/20   1.  Subtotal thrombotic occlusion of the proximal LAD, treated successfully with primary PCI using a 4.0 x 16 mm Synergy DES 2.  Moderate 50% mid left circumflex stenosis with TIMI-3 flow 3.  Widely patent, dominant RCA with no significant obstruction 4.  Elevated LVEDP consistent with acute systolic heart failure in the setting of acute myocardial infarction   Diagnostic Dominance: Right     Intervention     Echo: 08/13/20   IMPRESSIONS     1. Left ventricular ejection fraction, by estimation, is 35 to 40%. The  left ventricle has moderately decreased function. The left ventricle  demonstrates regional wall motion abnormalities (suggestive of LAD  disease). Left ventricular  diastolic  parameters are consistent with Grade I diastolic dysfunction (impaired  relaxation). No evidence of LV thrombus/   2. Right ventricular systolic function is normal. The right ventricular  size is normal. Tricuspid regurgitation signal is inadequate for assessing  PA pressure.   3. The mitral valve is normal in structure. No evidence of mitral valve  regurgitation.   4. The aortic valve is grossly normal. Aortic valve regurgitation is not  visualized. No aortic stenosis is present.   Comparison(s): No prior Echocardiogram  ASSESSMENT AND PLAN:  1.  Recent STEMI with CAD and stent to  LAD on ASA and Brilinta, he does have some bruising but not severe. He will notify us if any changes.  Continue asa and brilinta.  No angina and waking on treadmill.  No plans for cardiac rehab. Works from home on computer  2.  ICM from MI, unable to tolerate entresto with symptoms and fatigue and bump in Cr. Off entresto will check BMP may needs to try low dose of losartan.  Will check with Dr. Burt Knack.  Will check echo in 2 months for improvement.  On farxiga as well. Follow up with Dr. Burt Knack post echo.  10/17/20 discussed with Dr. Burt Knack with  elevated Cr. "I would see what the October echo looks like, repeat labs at that time, then we can decide further on med rx. If EF has normalized I would avoid ACE/ARB because I think risk of AKI is high. If EF 45% or less would probably rechallenge him with an ACE/ARB."  3.  HLD on statin will recheck hepatic and lipids on next visit.    4.  HTN stable on coreg 6.25, may want to try low dose ARB if Cr is stable.   5. CCL per Oncology   Current medicines are reviewed with the patient today.  The patient Has no concerns regarding medicines.  The following changes have been made:  See above Labs/ tests ordered today include:see above  Disposition:   FU:  see above  Signed, Cecilie Kicks, NP  10/16/2020 11:50 AM    [facility], [place], [place], [place] Phone: [phone]; Fax: [phone]

## 2020-10-16 ENCOUNTER — Encounter: Payer: Self-pay | Admitting: Cardiology

## 2020-10-16 ENCOUNTER — Other Ambulatory Visit: Payer: Self-pay

## 2020-10-16 ENCOUNTER — Ambulatory Visit: Payer: Managed Care, Other (non HMO) | Admitting: Cardiology

## 2020-10-16 VITALS — BP 130/72 | HR 80 | Ht 70.0 in | Wt 229.0 lb

## 2020-10-16 DIAGNOSIS — I10 Essential (primary) hypertension: Secondary | ICD-10-CM | POA: Diagnosis not present

## 2020-10-16 DIAGNOSIS — I255 Ischemic cardiomyopathy: Secondary | ICD-10-CM | POA: Diagnosis not present

## 2020-10-16 DIAGNOSIS — I2102 ST elevation (STEMI) myocardial infarction involving left anterior descending coronary artery: Secondary | ICD-10-CM | POA: Diagnosis not present

## 2020-10-16 DIAGNOSIS — E782 Mixed hyperlipidemia: Secondary | ICD-10-CM

## 2020-10-16 DIAGNOSIS — C9110 Chronic lymphocytic leukemia of B-cell type not having achieved remission: Secondary | ICD-10-CM | POA: Diagnosis not present

## 2020-10-16 NOTE — Patient Instructions (Addendum)
Medication Instructions:  Your physician recommends that you continue on your current medications as directed. Please refer to the Current Medication list given to you today.  *If you need a refill on your cardiac medications before your next appointment, please call your pharmacy*   Lab Work: TODAY:  BMET  If you have labs (blood work) drawn today and your tests are completely normal, you will receive your results only by: [place] (if you have MyChart) OR A paper copy in the mail If you have any lab test that is abnormal or we need to change your treatment, we will call you to review the results.   Testing/Procedures: Your physician has requested that you have an echocardiogram IN 2 MONTHS. Echocardiography is a painless test that uses sound waves to create images of your heart. It provides your doctor with information about the size and shape of your heart and how well your heart's chambers and valves are working. This procedure takes approximately one hour. There are no restrictions for this procedure.      Follow-Up: At [facility], you and your health needs are our priority.  As part of our continuing mission to provide you with exceptional heart care, we have created designated Provider Care Teams.  These Care Teams include your primary Cardiologist (physician) and Advanced Practice Providers (APPs -  Physician Assistants and Nurse Practitioners) who all work together to provide you with the care you need, when you need it.  We recommend signing up for the patient portal called "MyChart".  Sign up information is provided on this After Visit Summary.  MyChart is used to connect with patients for Virtual Visits (Telemedicine).  Patients are able to view lab/test results, encounter notes, upcoming appointments, etc.  Non-urgent messages can be sent to your provider as well.   To learn more about what you can do with MyChart, go to [email or web address].    Your next  appointment:   2 month(s) (AFTER ECHO)  The format for your next appointment:   In Person  Provider:   You may see Sherren Mocha, MD or one of the following Advanced Practice Providers on your designated Care Team:   [place], PA-C Richardson Dopp, [place]   Other Instructions Echocardiogram An echocardiogram is a test that uses sound waves (ultrasound) to produce images of the heart. Images from an echocardiogram can provide important information about: Heart size and shape. The size and thickness and movement of your heart's walls. Heart muscle function and strength. Heart valve function or if you have stenosis. Stenosis is when the heart valves are too narrow. If blood is flowing backward through the heart valves (regurgitation). A tumor or infectious growth around the heart valves. Areas of heart muscle that are not working well because of poor blood flow or injury from a heart attack. Aneurysm detection. An aneurysm is a weak or damaged part of an artery wall. The wall bulges out from the normal force of blood pumping through the body. Tell a health care provider about: Any allergies you have. All medicines you are taking, including vitamins, herbs, eye drops, creams, and over-the-counter medicines. Any blood disorders you have. Any surgeries you have had. Any medical conditions you have. Whether you are pregnant or may be pregnant. What are the risks? Generally, this is a safe test. However, problems may occur, including an allergic reaction to dye (contrast) that may be used during the test. What happens before the test? No specific preparation is needed. You may  eat and drink normally. What happens during the test?  You will take off your clothes from the waist up and put on a hospital gown. Electrodes or electrocardiogram (ECG)patches may be placed on your chest. The electrodes or patches are then connected to a device that monitors your heart rate and rhythm. You will  lie down on a table for an ultrasound exam. A gel will be applied to your chest to help sound waves pass through your skin. A handheld device, called a transducer, will be pressed against your chest and moved over your heart. The transducer produces sound waves that travel to your heart and bounce back (or "echo" back) to the transducer. These sound waves will be captured in real-time and changed into images of your heart that can be viewed on a video monitor. The images will be recorded on a computer and reviewed by your health care provider. You may be asked to change positions or hold your breath for a short time. This makes it easier to get different views or better views of your heart. In some cases, you may receive contrast through an IV in one of your veins. This can improve the quality of the pictures from your heart. The procedure may vary among health care providers and hospitals. What can I expect after the test? You may return to your normal, everyday life, including diet, activities, andmedicines, unless your health care provider tells you not to do that. Follow these instructions at home: It is up to you to get the results of your test. Ask your health care provider, or the department that is doing the test, when your results will be ready. Keep all follow-up visits. This is important. Summary An echocardiogram is a test that uses sound waves (ultrasound) to produce images of the heart. Images from an echocardiogram can provide important information about the size and shape of your heart, heart muscle function, heart valve function, and other possible heart problems. You do not need to do anything to prepare before this test. You may eat and drink normally. After the echocardiogram is completed, you may return to your normal, everyday life, unless your health care provider tells you not to do that. This information is not intended to replace advice given to you by your health care  provider. Make sure you discuss any questions you have with your healthcare provider. Document Revised: 10/09/2019 Document Reviewed: 10/09/2019 Elsevier Patient Education  2022 [employer].

## 2020-10-17 ENCOUNTER — Telehealth: Payer: Self-pay

## 2020-10-17 LAB — BASIC METABOLIC PANEL
BUN/Creatinine Ratio: 12 (ref 10–24)
BUN: 21 mg/dL (ref 8–27)
CO2: 20 mmol/L (ref 20–29)
Calcium: 9.3 mg/dL (ref 8.6–10.2)
Chloride: 105 mmol/L (ref 96–106)
Creatinine, Ser: 1.72 mg/dL — ABNORMAL HIGH (ref 0.76–1.27)
Glucose: 88 mg/dL (ref 65–99)
Potassium: 4.8 mmol/L (ref 3.5–5.2)
Sodium: 138 mmol/L (ref 134–144)
eGFR: 43 mL/min/{1.73_m2} — ABNORMAL LOW (ref >59)

## 2020-10-17 NOTE — Progress Notes (Signed)
Hi Mickel Baas - thanks for seeing him. I think with the increased creatinine, I would see what the October echo looks like, repeat labs at that time, then we can decide further on med rx. If EF has normalized I would avoid ACE/ARB because I think risk of AKI is high. If EF 45% or less would probably rechallenge him with an ACE/ARB.  Thx Ronalee Belts

## 2020-10-17 NOTE — Telephone Encounter (Signed)
For his Cr no longer on entresto or ARB will not add.  His cr has been this high in past so please send copy to PCP  thanks. Mickel Baas    Pt advised and will forward his result to his PCP... Dr. Deland Pretty.

## 2020-11-14 ENCOUNTER — Telehealth: Payer: Self-pay | Admitting: Hematology

## 2020-11-14 NOTE — Telephone Encounter (Signed)
Scheduled appt per 9/16 referral. Pt is are of appt date and time.

## 2020-11-18 NOTE — Progress Notes (Signed)
I, Walter Chapman, LAT, ATC acting as a scribe for Walter Leader, MD.  Subjective:    CC: L thigh pain  HPI: Pt is a 65 y/o male c/o L thigh pain x 3+ weeks. Pt locates pain to the posterior aspect of his L thigh, mid-hamstring. Pt reports that he has started to exercise more since having a MI in June, but doesn't recall a specific mechanism. Pt notes that he works from home, sitting at a desk, 4days/week, 10hour/day.  Low back pain: yes- L side Radiates: yes- radiating to L anterior thigh to L knee LE Numbness/tingling: no LE Weakness: no Aggravates: extended back, standing, worse in the mornings.  Treatments tried: propping leg up,   Pertinent review of Systems: No fevers or chills  Relevant historical information: MI in June.  CLL.   Objective:    Vitals:   11/19/20 1429  BP: (!) 142/88  Pulse: 72  SpO2: 99%   General: Well Developed, well nourished, and in no acute distress.   MSK: L-spine nontender midline.  Mildly tender palpation left lumbar paraspinal musculature. Normal lumbar motion.  Some reproduced left buttocks and left leg pain with flexion. Actually strength is intact. Reflexes and sensation are intact throughout. Occasional positive left-sided slump test or straight leg raise test however this is not consistent.  Left hip normal-appearing Normal motion. Nontender. Hamstring is nontender as is buttocks. Unable to reproduce pain with resisted hamstring strength testing and hip extension. Negative H test.  Lab and Radiology Results  X-ray images L-spine and left hip obtained today personally and independently interpreted  L-spine: Possible T11 compression deformity.  Mild DDD lower portion lumbar spine.  No acute fracture or otherwise. Abdominal aortic atherosclerosis present.  Left hip: No acute fractures or severe degenerative changes. Enthesopathic changes present iliac crest and greater trochanter bilaterally Calcific structure right pelvis  and phleboliths present throughout pelvis.  Await formal radiology review    Impression and Recommendations:    Assessment and Plan: 65 y.o. male with left posterior thigh vein to occasional low back pain.  This has been ongoing for few weeks.  Pain possibly lumbosacral radiculopathy versus muscle irritation hamstring to hip abductor to extensors.  Symptoms are somewhat intermittent and hard to reproduce in clinic.  Doubtful this represents a serious or life-threatening problem.  I think it is very likely to have resolution with physical therapy focused on core strengthening and hip abductor strengthening.  Plan to refer to PT.  Check back with me in about 6 weeks.  If not improved could proceed to further diagnostic testing either MRI lumbar spine or hip.  Await formal radiology review x-ray L-spine and hip.  May need advanced imaging based on possible compression deformity around T-11 however he does not hurt in this area and appears to be old.  PDMP not reviewed this encounter. Orders Placed This Encounter  Procedures   DG HIP UNILAT W OR W/O PELVIS 2-3 VIEWS LEFT    Standing Status:   Future    Number of Occurrences:   1    Standing Expiration Date:   11/19/2021    Order Specific Question:   Reason for Exam (SYMPTOM  OR DIAGNOSIS REQUIRED)    Answer:   left thigh pain    Order Specific Question:   Preferred imaging location?    Answer:   [facility]   DG Lumbar Spine 2-3 Views    Standing Status:   Future    Number of Occurrences:  1    Standing Expiration Date:   11/19/2021    Order Specific Question:   Reason for Exam (SYMPTOM  OR DIAGNOSIS REQUIRED)    Answer:   low back pain    Order Specific Question:   Preferred imaging location?    Answer:   [facility]   Ambulatory referral to Physical Therapy    Referral Priority:   Routine    Referral Type:   Physical Medicine    Referral Reason:   Specialty Services Required    Requested Specialty:   Physical  Therapy    Number of Visits Requested:   1   No orders of the defined types were placed in this encounter.   Discussed warning signs or symptoms. Please see discharge instructions. Patient expresses understanding.   The above documentation has been reviewed and is accurate and complete Walter Chapman, M.D.

## 2020-11-19 ENCOUNTER — Ambulatory Visit (INDEPENDENT_AMBULATORY_CARE_PROVIDER_SITE_OTHER): Payer: Managed Care, Other (non HMO) | Admitting: Family Medicine

## 2020-11-19 ENCOUNTER — Ambulatory Visit (INDEPENDENT_AMBULATORY_CARE_PROVIDER_SITE_OTHER): Payer: Managed Care, Other (non HMO)

## 2020-11-19 ENCOUNTER — Other Ambulatory Visit: Payer: Self-pay

## 2020-11-19 ENCOUNTER — Ambulatory Visit: Payer: Self-pay

## 2020-11-19 VITALS — BP 142/88 | HR 72 | Ht 70.0 in | Wt 227.6 lb

## 2020-11-19 DIAGNOSIS — M79652 Pain in left thigh: Secondary | ICD-10-CM

## 2020-11-19 NOTE — Patient Instructions (Addendum)
Thank you for coming in today.   I've referred you to Physical Therapy.  Let us know if you don't hear from them in one week.   Please get an Xray today before you leave   Recheck back in 6 weeks

## 2020-11-20 ENCOUNTER — Inpatient Hospital Stay: Payer: Managed Care, Other (non HMO)

## 2020-11-20 ENCOUNTER — Inpatient Hospital Stay: Payer: Managed Care, Other (non HMO) | Attending: Hematology | Admitting: Hematology

## 2020-11-20 ENCOUNTER — Encounter: Payer: Self-pay | Admitting: Hematology

## 2020-11-20 VITALS — BP 124/75 | HR 58 | Temp 98.2°F | Resp 20 | Wt 227.2 lb

## 2020-11-20 DIAGNOSIS — Z87891 Personal history of nicotine dependence: Secondary | ICD-10-CM | POA: Insufficient documentation

## 2020-11-20 DIAGNOSIS — Z79899 Other long term (current) drug therapy: Secondary | ICD-10-CM

## 2020-11-20 DIAGNOSIS — C9110 Chronic lymphocytic leukemia of B-cell type not having achieved remission: Secondary | ICD-10-CM

## 2020-11-20 NOTE — Progress Notes (Signed)
[facility]   HEMATOLOGY/ONCOLOGY CONSULTATION NOTE  Date of Service: 11/20/2020  Patient Care Team: [facility] [place] as PCP - General (Rheumatology) Lorretta Harp, MD as PCP - Cardiology (Cardiology)  CHIEF COMPLAINTS/PURPOSE OF CONSULTATION:  Continued management of CLL  HISTORY OF PRESENTING ILLNESS:   Walter Chapman is a wonderful 65 y.o. male who has been referred to [place] by Dr .Rolley Sims, [place]  for evaluation and management of CLL.  Patient has a history of hypertension, dyslipidemia, STEMI status post PCI to LAD, ischemic cardiomyopathy who notes that he was diagnosed with CLL about 10+ years ago. Patient notes his blood counts with regards to CLL were stable after follow-up for several years and he has never needed treatment for CLL.  He notes that he has not really had any symptoms from CLL, symptomatic splenomegaly or lymphadenopathy or any concerns with anemia or thrombocytopenia. He is a unaware of any previous genetic mutation testing. He was lost to follow-up several years ago and has been referred by his primary care physician to set up continued follow-up for his CLL.  Patient had an ST elevation MI in June 2022 and continues to follow-up with cardiology for management of his ischemic cardiomyopathy.  Labs done on August 13, 2020 including CBC showed WBC count of 34.4k with no differential, hemoglobin of 14.5 and platelet count of 139k. He notes no fevers no chills no night sweats no new lumps or bumps. No abdominal pain or distention. No sudden weight loss.. No other acute new focal symptoms.  Patient notes he prefers to have his labs done at [place] where he works since it works out better for him financially.  Prescriptions were given to him for his outside labs.   MEDICAL HISTORY:  Past Medical History:  Diagnosis Date   CAD in native artery    Cardiomyopathy, ischemic    CLL (chronic lymphocytic leukemia) (HCC)    HLD (hyperlipidemia)     STEMI (ST elevation myocardial infarction) ([place]) PCI to LAD     SURGICAL HISTORY: Past Surgical History:  Procedure Laterality Date   CORONARY/GRAFT ACUTE MI REVASCULARIZATION N/A 08/12/2020   Procedure: Coronary/Graft Acute MI Revascularization;  Surgeon: Sherren Mocha, MD;  Location: [facility];  Service: Cardiovascular;  Laterality: N/A;   LEFT HEART CATH AND CORONARY ANGIOGRAPHY N/A 08/12/2020   Procedure: LEFT HEART CATH AND CORONARY ANGIOGRAPHY;  Surgeon: Sherren Mocha, MD;  Location: [facility];  Service: Cardiovascular;  Laterality: N/A;    SOCIAL HISTORY: Social History   Socioeconomic History   Marital status: Married    Spouse name: Not on file   Number of children: Not on file   Years of education: Not on file   Highest education level: Not on file  Occupational History   Not on file  Tobacco Use   Smoking status: Former    Types: Cigarettes   Smokeless tobacco: Never  Substance and Sexual Activity   Alcohol use: Not on file   Drug use: Not on file   Sexual activity: Not on file  Other Topics Concern   Not on file  Social History Narrative   Not on file   Social Determinants of Health   Financial Resource Strain: Not on file  Food Insecurity: Not on file  Transportation Needs: Not on file  Physical Activity: Not on file  Stress: Not on file  Social Connections: Not on file  Intimate Partner Violence: Not on file    FAMILY HISTORY: Family History  Problem Relation Age of Onset   Coronary artery disease Brother 43       had CABG    ALLERGIES:  has No Known Allergies.  MEDICATIONS:  Current Outpatient Medications  Medication Sig Dispense Refill   aspirin 81 MG chewable tablet Chew 1 tablet (81 mg total) by mouth daily. 90 tablet 2   atorvastatin (LIPITOR) 80 MG tablet Take 1 tablet (80 mg total) by mouth daily. 90 tablet 1   carvedilol (COREG) 6.25 MG tablet Take 1 tablet (6.25 mg total) by mouth 2 (two) times daily. 180  tablet 3   dapagliflozin propanediol (FARXIGA) 10 MG TABS tablet Take 1 tablet (10 mg total) by mouth daily. 90 tablet 3   multivitamin (ONE-A-DAY MEN'S) TABS tablet Take 1 tablet by mouth daily with breakfast.     nitroGLYCERIN (NITROSTAT) 0.4 MG SL tablet Place 1 tablet (0.4 mg total) under the tongue every 5 (five) minutes x 3 doses as needed for chest pain. 25 tablet 2   ticagrelor (BRILINTA) 90 MG TABS tablet Take 1 tablet (90 mg total) by mouth 2 (two) times daily. 180 tablet 2   ZEGERID OTC 20-1100 MG CAPS capsule Take 1 capsule by mouth daily as needed (for indigestion).     guaiFENesin (MUCINEX) 600 MG 12 hr tablet  (Patient not taking: No sig reported)     No current facility-administered medications for this visit.    REVIEW OF SYSTEMS:    10 Point review of Systems was done is negative except as noted above.  PHYSICAL EXAMINATION: ECOG PERFORMANCE STATUS: 1 - Symptomatic but completely ambulatory  . Vitals:   11/20/20 1130  BP: 124/75  Pulse: (!) 58  Resp: 20  Temp: 98.2 F (36.8 C)  SpO2: 98%   Filed Weights   11/20/20 1130  Weight: 227 lb 3.2 oz (103.1 kg)   .Body mass index is 32.6 kg/m.  GENERAL:alert, in no acute distress and comfortable SKIN: no acute rashes, no significant lesions EYES: conjunctiva are pink and non-injected, sclera anicteric OROPHARYNX: MMM, no exudates, no oropharyngeal erythema or ulceration NECK: supple, no JVD LYMPH:  no palpable lymphadenopathy in the cervical, axillary or inguinal regions LUNGS: clear to auscultation b/l with normal respiratory effort HEART: regular rate & rhythm ABDOMEN:  normoactive bowel sounds , non tender, not distended. Extremity: no pedal edema PSYCH: alert & oriented x 3 with fluent speech NEURO: no focal motor/sensory deficits  LABORATORY DATA:  I have reviewed the data as listed  . CBC Latest Ref Rng & Units 08/13/2020 08/12/2020 08/12/2020  WBC 4.0 - 10.5 K/uL 34.4(H) - 49.0(H)  Hemoglobin 13.0 -  17.0 g/dL 14.5 15.0 16.4  Hematocrit 39.0 - 52.0 % 43.4 44.0 51.5  Platelets 150 - 400 K/uL 139(L) - 183    . CMP Latest Ref Rng & Units 10/16/2020 09/16/2020 08/13/2020  Glucose 65 - 99 mg/dL 88 86 110(H)  BUN 8 - 27 mg/dL 21 17 22   Creatinine 0.76 - 1.27 mg/dL 1.72(H) 1.65(H) 1.55(H)  Sodium 134 - 144 mmol/L 138 141 142  Potassium 3.5 - 5.2 mmol/L 4.8 4.9 4.5  Chloride 96 - 106 mmol/L 105 107(H) 108  CO2 20 - 29 mmol/L 20 21 24   Calcium 8.6 - 10.2 mg/dL 9.3 9.2 8.6(L)  Total Protein 6.4 - 8.1 g/dL - - -  Total Bilirubin 0.20 - 1.60 mg/dl - - -  Alkaline Phos 26 - 84 U/L - - -  AST 11 - 38 U/L - - -  ALT  10 - 47 U/L - - -     RADIOGRAPHIC STUDIES: I have personally reviewed the radiological images as listed and agreed with the findings in the report. No results found.  ASSESSMENT & PLAN:   66 year old male with previous history of CLL for more than 10 years never having required treatment and with no previous history of symptomatic disease with.  #1 chronic lymphocytic leukemia likely Rai stage 0. No overt clinical lymphadenopathy or splenomegaly. Last CBC in June with no anemia and minimal thrombocytopenia likely related to medications and not his CLL. Plan -Patient has no clinical symptomatology suggesting constitutional symptoms from his CLL at this time. -No fevers no chills no night sweats no unexpected weight loss. -Would want to get labs.  Was given prescription for getting his labs at his [facility] facility as per his preference patient to have his outside lab fax the results to our clinic for review. -Resuming no significant changes on his labs we will see him back in 6 months with repeat labs -CLL FISH prognostic panel -We will hold off on imaging at this time . Orders Placed This Encounter  Procedures   Miscellaneous [facility] test (send-out)    Standing Status:   Future    Standing Expiration Date:   11/20/2021    Order Specific Question:   Test name / description:     Answer:   CBC/diff, cmp, LDH, Flow cytometry for likely CLL, CLL Prognostic FISH panel   CBC with Differential/Platelet    Standing Status:   Future    Standing Expiration Date:   11/20/2021   CMP ([facility] only)    Standing Status:   Future    Standing Expiration Date:   11/20/2021   Lactate dehydrogenase    Standing Status:   Future    Standing Expiration Date:   11/20/2021   Flow Cytometry, Peripheral Blood (Oncology)    Standing Status:   Future    Standing Expiration Date:   11/20/2021   FISH, CLL Prognostic Panel    Standing Status:   Future    Standing Expiration Date:   11/20/2021     All of the patients questions were answered with apparent satisfaction. The patient knows to call the clinic with any problems, questions or concerns.  I spent 30 minutes counseling the patient face to face. The total time spent in the appointment was 45 minutes and more than 50% was on counseling and direct patient cares.    Sullivan Lone MD [place] [employer] [facility] Hematology/Oncology Physician [facility]  (Office):       [phone] (Work cell):  [phone] (Fax):           [phone]  11/20/2020 11:59 AM

## 2020-11-20 NOTE — Patient Instructions (Signed)
Thank you for choosing [facility] to provide your care.   Should you have questions after your visit to the [facility] ([facility]), please contact this office at [phone] between 8:30 AM and 4:30 PM.  Voice mails left after 4:00 PM may not be returned until the following business day.  Calls received after 4:30 PM will be answered by an off-site Nurse Triage Line.    Prescription Refills:  Please have your pharmacy contact us directly for most prescription requests.  Contact the office directly for refills of narcotics (pain medications). Allow 48-72 hours for refills.  Appointments: Please contact the [facility] scheduling department [phone] for questions regarding [facility] appointment scheduling.  Contact the schedulers with any scheduling changes so that your appointment can be rescheduled in a timely manner.   Central Scheduling for [place] [phone] - Call to schedule procedures such as PET scans, CT scans, MRI, Ultrasound, etc.  To afford each patient quality time with our providers, please arrive 30 minutes before your scheduled appointment time.  If you arrive late for your appointment, you may be asked to reschedule.  We strive to give you quality time with our providers, and arriving late affects you and other patients whose appointments are after yours. If you are a no show for multiple scheduled visits, you may be dismissed from the clinic at the providers discretion.     Resources: [facility] Social Workers [phone] for additional information on assistance programs or assistance connecting with community support programs   [place] DSS  [phone]: Information regarding food stamps, Medicaid, and utility assistance [place] Access [place] [phone]   [place] [employer]'s shared-ride transportation service for eligible riders who have a disability that prevents them from riding the fixed route bus.   Medicare Rights Center 800-333-4114  Helps people with Medicare understand their rights and benefits, navigate the Medicare system, and secure the quality healthcare they deserve [employer] 800-227-2345 Assists patients locate various types of support and financial assistance Cancer Care: 1-800-813-HOPE (4673) Provides financial assistance, online support groups, medication/co-pay assistance.   Transportation Assistance for appointments at [facility]: Transportation Coordinator [phone]  Again, thank you for choosing [facility] for your care.       

## 2020-11-21 NOTE — Progress Notes (Signed)
Left hip x-ray looks normal to radiology.

## 2020-11-21 NOTE — Progress Notes (Signed)
Lumbar spine x-ray shows some arthritis changes in the low back.

## 2020-12-04 ENCOUNTER — Other Ambulatory Visit: Payer: Self-pay | Admitting: Cardiology

## 2020-12-10 ENCOUNTER — Other Ambulatory Visit: Payer: Self-pay | Admitting: Cardiology

## 2020-12-18 ENCOUNTER — Other Ambulatory Visit: Payer: Self-pay

## 2020-12-18 ENCOUNTER — Ambulatory Visit (HOSPITAL_COMMUNITY): Payer: Managed Care, Other (non HMO) | Attending: Internal Medicine

## 2020-12-18 DIAGNOSIS — I10 Essential (primary) hypertension: Secondary | ICD-10-CM | POA: Insufficient documentation

## 2020-12-18 DIAGNOSIS — I2102 ST elevation (STEMI) myocardial infarction involving left anterior descending coronary artery: Secondary | ICD-10-CM | POA: Diagnosis present

## 2020-12-18 DIAGNOSIS — I255 Ischemic cardiomyopathy: Secondary | ICD-10-CM | POA: Insufficient documentation

## 2020-12-18 LAB — ECHOCARDIOGRAM COMPLETE
Area-P 1/2: 2.68 cm2
S' Lateral: 3.2 cm

## 2020-12-23 DIAGNOSIS — I2510 Atherosclerotic heart disease of native coronary artery without angina pectoris: Secondary | ICD-10-CM | POA: Insufficient documentation

## 2020-12-23 NOTE — Progress Notes (Signed)
Cardiology Office Note:    Date:  12/24/2020   ID:  [name], DOB [birth year], MRN [id number]  PCP:  [facility] [place] Providers Cardiologist:  Sherren Mocha, MD Cardiology APP:  [facility] [place]     Referring MD: [facility] [place]   Chief Complaint:  F/u for CAD, Ischemic CM    Patient Profile:   Walter Chapman is a 65 y.o. male with:  Coronary artery disease  Ant STEMI 6/22 s/p DES to pLAD Ischemic CM Echocardiogram 6/22: EF 35-40  Echocardiogram 10/22: EF 60-65 Hypertension  Hyperlipidemia  Chronic kidney disease Chronic Lymphocytic Leukemia    History of Present Illness: Walter Chapman was last seen in 8/22 by Cecilie Kicks, NP.  He had a f/u echocardiogram this month that demonstrated improved LVF with EF 60-65.  The Entresto was DCd due to dizziness, worsening renal function.  He returns for f/u.  He is here with his wife.  Since last seen, he has done well.  He has not had chest pain, shortness of breath, syncope, orthopnea, leg edema.  He walks on a treadmill several times a week without limitations.  Since stopping the [facility], he has not had further dizziness or weakness.     Assessment and Plan:   Coronary artery disease involving native coronary artery of native heart without angina pectoris Status post anterior STEMI in June 2022 treated with a DES to the proximal LAD.  Initial EF was 35-40 but has improved to normal.  He is doing well without anginal symptoms.  We discussed the importance of dual antiplatelet therapy for a minimum of 12 months post ACS.  Continue aspirin 81 mg daily, ticagrelor 90 mg twice daily, atorvastatin 80 mg daily, carvedilol 6.25 mg twice daily.  Follow-up in 6 months with Dr. Burt Knack.  Hyperlipidemia Continue atorvastatin 80 mg daily.  Arrange fasting CMET, lipids.  Ischemic cardiomyopathy EF was initially 35-40.  This is improved to normal by most recent echocardiogram.  He could not  tolerate Entresto due to worsening renal function, dizziness.  Continue carvedilol 6.25 mg twice daily.  Hypertension Blood pressure is well controlled.  Continue carvedilol 6.25 mg twice daily.  CKD (chronic kidney disease) stage 3, GFR 30-59 ml/min (HCC) Creatinine has remained fairly stable.  Obtain follow-up CMET.    Dispo:  Return in about 6 months (around 06/24/2021) for Routine follow up in 6 months with Dr. Burt Knack. .    Prior CV studies: Echocardiogram 12/18/20 EF 60-65, no RWMA, mild LVH, normal diastolic fxn, normal RVSF, RVSP 25.1 mmHg, trivial MR  Echocardiogram 08/13/20 EF 35-40, ant, ant-sept, apical lat, inf-sept and apical HK, Gr 1 DD, normal RVSF  Cardiac catheterization 08/12/20 LAD prox sub-total occlusion  LCx mid 50 RCA patent PCI:  4 x 16 mm Synergy DES to pLAD      Past Medical History:  Diagnosis Date   CAD in native artery    Cardiomyopathy, ischemic    CLL (chronic lymphocytic leukemia) (HCC)    HLD (hyperlipidemia)    STEMI (ST elevation myocardial infarction) ([place]) PCI to LAD    Current Medications: Current Meds  Medication Sig   aspirin 81 MG chewable tablet Chew 1 tablet (81 mg total) by mouth daily.   atorvastatin (LIPITOR) 80 MG tablet TAKE 1 TABLET BY MOUTH DAILY   carvedilol (COREG) 6.25 MG tablet Take 1 tablet (6.25 mg total) by mouth 2 (two) times daily.   FARXIGA 10 MG TABS tablet TAKE 1  TABLET(10 MG) BY MOUTH DAILY   multivitamin (ONE-A-DAY MEN'S) TABS tablet Take 1 tablet by mouth daily with breakfast.   nitroGLYCERIN (NITROSTAT) 0.4 MG SL tablet Place 1 tablet (0.4 mg total) under the tongue every 5 (five) minutes x 3 doses as needed for chest pain.   ticagrelor (BRILINTA) 90 MG TABS tablet Take 1 tablet (90 mg total) by mouth 2 (two) times daily.   ZEGERID OTC 20-1100 MG CAPS capsule Take 1 capsule by mouth daily as needed (for indigestion).    Allergies:   Patient has no known allergies.   Social History   Tobacco Use   Smoking  status: Former    Types: Cigarettes   Smokeless tobacco: Never    Family Hx: The patient's family history includes Coronary artery disease (age of onset: 48) in his brother.  Review of Systems  Gastrointestinal:  Negative for hematochezia and melena.  Genitourinary:  Negative for hematuria.    EKGs/Labs/Other Test Reviewed:    EKG:  EKG is not ordered today.  The ekg ordered today demonstrates n/a  Recent Labs: 08/12/2020: TSH 1.747 08/13/2020: Hemoglobin 14.5; Platelets 139 10/16/2020: BUN 21; Creatinine, Ser 1.72; Potassium 4.8; Sodium 138   Recent Lipid Panel Lab Results  Component Value Date/Time   CHOL 174 08/13/2020 01:45 AM   TRIG 156 (H) 08/13/2020 01:45 AM   HDL 39 (L) 08/13/2020 01:45 AM   LDLCALC 104 (H) 08/13/2020 01:45 AM     Risk Assessment/Calculations:          Physical Exam:    VS:  BP 116/74   Pulse 66   Ht 5' 10" (1.778 m)   Wt 224 lb (101.6 kg)   SpO2 97%   BMI 32.14 kg/m     Wt Readings from Last 3 Encounters:  12/24/20 224 lb (101.6 kg)  11/20/20 227 lb 3.2 oz (103.1 kg)  11/19/20 227 lb 9.6 oz (103.2 kg)    Constitutional:      Appearance: Healthy appearance. Not in distress.  Neck:     Vascular: JVD normal.  Pulmonary:     Effort: Pulmonary effort is normal.     Breath sounds: No wheezing. No rales.  Cardiovascular:     Normal rate. Regular rhythm. Normal S1. Normal S2.      Murmurs: There is no murmur.  Edema:    Peripheral edema absent.  Abdominal:     Palpations: Abdomen is soft. There is no hepatomegaly.  Skin:    General: Skin is warm and dry.  Neurological:     General: No focal deficit present.     Mental Status: Alert and oriented to person, place and time.     Cranial Nerves: Cranial nerves are intact.       Medication Adjustments/Labs and Tests Ordered: Current medicines are reviewed at length with the patient today.  Concerns regarding medicines are outlined above.  Tests Ordered: Orders Placed This Encounter   Procedures   Comp Met (CMET)   Lipid Profile   Lipid Profile   Medication Changes: No orders of the defined types were placed in this encounter.  Signed, Richardson Dopp, PA-C  12/24/2020 11:35 AM    [facility] [place], [place], [place]  [address] Phone: [phone]; Fax: [phone]

## 2020-12-24 ENCOUNTER — Other Ambulatory Visit: Payer: Self-pay

## 2020-12-24 ENCOUNTER — Encounter: Payer: Self-pay | Admitting: Physician Assistant

## 2020-12-24 ENCOUNTER — Ambulatory Visit: Payer: Managed Care, Other (non HMO) | Admitting: Physician Assistant

## 2020-12-24 VITALS — BP 116/74 | HR 66 | Ht 70.0 in | Wt 224.0 lb

## 2020-12-24 DIAGNOSIS — I2510 Atherosclerotic heart disease of native coronary artery without angina pectoris: Secondary | ICD-10-CM

## 2020-12-24 DIAGNOSIS — N1831 Chronic kidney disease, stage 3a: Secondary | ICD-10-CM

## 2020-12-24 DIAGNOSIS — I10 Essential (primary) hypertension: Secondary | ICD-10-CM

## 2020-12-24 DIAGNOSIS — I255 Ischemic cardiomyopathy: Secondary | ICD-10-CM | POA: Diagnosis not present

## 2020-12-24 DIAGNOSIS — E782 Mixed hyperlipidemia: Secondary | ICD-10-CM | POA: Diagnosis not present

## 2020-12-24 DIAGNOSIS — N183 Chronic kidney disease, stage 3 unspecified: Secondary | ICD-10-CM | POA: Insufficient documentation

## 2020-12-24 NOTE — Patient Instructions (Signed)
Medication Instructions:   Your physician recommends that you continue on your current medications as directed. Please refer to the Current Medication list given to you today.  *If you need a refill on your cardiac medications before your next appointment, please call your pharmacy*   Lab Work: Your physician recommends that you return for a FASTING lipid profile:/cmet at [place].   If you have labs (blood work) drawn today and your tests are completely normal, you will receive your results only by: [place] (if you have MyChart) OR A paper copy in the mail If you have any lab test that is abnormal or we need to change your treatment, we will call you to review the results.   Testing/Procedures:  -NONE   Follow-Up: At [facility], you and your health needs are our priority.  As part of our continuing mission to provide you with exceptional heart care, we have created designated Provider Care Teams.  These Care Teams include your primary Cardiologist (physician) and Advanced Practice Providers (APPs -  Physician Assistants and Nurse Practitioners) who all work together to provide you with the care you need, when you need it.  We recommend signing up for the patient portal called "MyChart".  Sign up information is provided on this After Visit Summary.  MyChart is used to connect with patients for Virtual Visits (Telemedicine).  Patients are able to view lab/test results, encounter notes, upcoming appointments, etc.  Non-urgent messages can be sent to your provider as well.   To learn more about what you can do with MyChart, go to [email or web address].    Your next appointment:   6 month(s)  The format for your next appointment:   In Person  Provider:   Dr. Burt Knack   Other Instructions Your physician wants you to follow-up in: 6 months with Dr.Cooper.  You will receive a reminder letter in the mail two months in advance. If you don't receive a letter, please call our  office to schedule the follow-up appointment.

## 2020-12-24 NOTE — Assessment & Plan Note (Signed)
Status post anterior STEMI in June 2022 treated with a DES to the proximal LAD.  Initial EF was 35-40 but has improved to normal.  He is doing well without anginal symptoms.  We discussed the importance of dual antiplatelet therapy for a minimum of 12 months post ACS.  Continue aspirin 81 mg daily, ticagrelor 90 mg twice daily, atorvastatin 80 mg daily, carvedilol 6.25 mg twice daily.  Follow-up in 6 months with Dr. Burt Knack.

## 2020-12-24 NOTE — Assessment & Plan Note (Signed)
Blood pressure is well controlled.  Continue carvedilol 6.25 mg twice daily.

## 2020-12-24 NOTE — Assessment & Plan Note (Signed)
Creatinine has remained fairly stable.  Obtain follow-up CMET.

## 2020-12-24 NOTE — Assessment & Plan Note (Signed)
Continue atorvastatin 80 mg daily.  Arrange fasting CMET, lipids.

## 2020-12-24 NOTE — Assessment & Plan Note (Signed)
EF was initially 35-40.  This is improved to normal by most recent echocardiogram.  He could not tolerate Entresto due to worsening renal function, dizziness.  Continue carvedilol 6.25 mg twice daily.

## 2020-12-26 LAB — COMPREHENSIVE METABOLIC PANEL
ALT: 24 IU/L (ref 0–44)
AST: 22 IU/L (ref 0–40)
Albumin/Globulin Ratio: 2.7 — ABNORMAL HIGH (ref 1.2–2.2)
Albumin: 4.6 g/dL (ref 3.8–4.8)
Alkaline Phosphatase: 101 IU/L (ref 44–121)
BUN/Creatinine Ratio: 13 (ref 10–24)
BUN: 21 mg/dL (ref 8–27)
Bilirubin Total: 0.7 mg/dL (ref 0.0–1.2)
CO2: 23 mmol/L (ref 20–29)
Calcium: 9.6 mg/dL (ref 8.6–10.2)
Chloride: 105 mmol/L (ref 96–106)
Creatinine, Ser: 1.61 mg/dL — ABNORMAL HIGH (ref 0.76–1.27)
Globulin, Total: 1.7 g/dL (ref 1.5–4.5)
Glucose: 86 mg/dL (ref 70–99)
Potassium: 5.3 mmol/L — ABNORMAL HIGH (ref 3.5–5.2)
Sodium: 144 mmol/L (ref 134–144)
Total Protein: 6.3 g/dL (ref 6.0–8.5)
eGFR: 47 mL/min/{1.73_m2} — ABNORMAL LOW (ref >59)

## 2020-12-26 LAB — LIPID PANEL
Chol/HDL Ratio: 3 ratio (ref 0.0–5.0)
Cholesterol, Total: 130 mg/dL (ref 100–199)
HDL: 44 mg/dL (ref >39)
LDL Chol Calc (NIH): 71 mg/dL (ref 0–99)
Triglycerides: 78 mg/dL (ref 0–149)
VLDL Cholesterol Cal: 15 mg/dL (ref 5–40)

## 2020-12-30 ENCOUNTER — Telehealth: Payer: Self-pay | Admitting: Cardiovascular Disease

## 2020-12-30 ENCOUNTER — Other Ambulatory Visit: Payer: Self-pay | Admitting: *Deleted

## 2020-12-30 DIAGNOSIS — E875 Hyperkalemia: Secondary | ICD-10-CM

## 2020-12-30 NOTE — Telephone Encounter (Signed)
Patient states he was returning a call

## 2021-01-08 LAB — BASIC METABOLIC PANEL
BUN/Creatinine Ratio: 11 (ref 10–24)
BUN: 19 mg/dL (ref 8–27)
CO2: 23 mmol/L (ref 20–29)
Calcium: 9.3 mg/dL (ref 8.6–10.2)
Chloride: 105 mmol/L (ref 96–106)
Creatinine, Ser: 1.77 mg/dL — ABNORMAL HIGH (ref 0.76–1.27)
Glucose: 96 mg/dL (ref 70–99)
Potassium: 4.8 mmol/L (ref 3.5–5.2)
Sodium: 142 mmol/L (ref 134–144)
eGFR: 42 mL/min/{1.73_m2} — ABNORMAL LOW (ref >59)

## 2021-01-12 ENCOUNTER — Other Ambulatory Visit: Payer: Self-pay | Admitting: *Deleted

## 2021-01-12 DIAGNOSIS — I2510 Atherosclerotic heart disease of native coronary artery without angina pectoris: Secondary | ICD-10-CM

## 2021-01-12 DIAGNOSIS — E782 Mixed hyperlipidemia: Secondary | ICD-10-CM

## 2021-01-12 MED ORDER — EZETIMIBE 10 MG PO TABS: 10.0000 mg | ORAL_TABLET | Freq: Every day | ORAL | 3 refills | Status: DC

## 2021-01-14 ENCOUNTER — Other Ambulatory Visit (HOSPITAL_BASED_OUTPATIENT_CLINIC_OR_DEPARTMENT_OTHER): Payer: Self-pay | Admitting: *Deleted

## 2021-01-14 DIAGNOSIS — E782 Mixed hyperlipidemia: Secondary | ICD-10-CM

## 2021-01-14 DIAGNOSIS — I2510 Atherosclerotic heart disease of native coronary artery without angina pectoris: Secondary | ICD-10-CM

## 2021-03-12 ENCOUNTER — Other Ambulatory Visit: Payer: Self-pay | Admitting: Cardiology

## 2021-03-25 ENCOUNTER — Telehealth: Payer: Self-pay | Admitting: *Deleted

## 2021-03-25 NOTE — Telephone Encounter (Signed)
° °  Pre-operative Risk Assessment    Patient Name: Walter Chapman  DOB: 1955/10/09 MRN: [id number]      Request for Surgical Clearance    Procedure:   COLONOSCOPY  Date of Surgery:  Clearance 05/14/21                                 Surgeon:  DR. HUNG Surgeon's Group or Practice Name:  [facility] Phone number:  [phone] Fax number:  [phone]   Type of Clearance Requested:   - Medical  - Pharmacy:  Hold Ticagrelor (Brilinta)     Type of Anesthesia:   PROPOFOL   Additional requests/questions:    [name]   03/25/2021, 1:54 PM

## 2021-03-25 NOTE — Telephone Encounter (Signed)
Dr. Burt Knack Pt had anterior STEMI 07/2020 treated with DES to pLAD.   We are asked to hold brilinta for colonoscopy in March, greater than 6 months from PCI.   OK to hold brilinta and continue ASA for c-scop?

## 2021-03-26 NOTE — Telephone Encounter (Signed)
I will forward notes to requesting office to please see notes from Dr. Burt Knack as to the urgency of procedure. Please reply if procedure is of urgent matter or if ok to postpone procedure as pr preference of Dr. Burt Knack to wait until June 2023 if not urgent. See notes.

## 2021-03-26 NOTE — Telephone Encounter (Signed)
Since stent was implanted in the prox LAD during an anterior STEMI, I would recommend delaying colonoscopy until June '23 when he is 12 months out from PCI. If colonoscopy is needed more urgently, it is ok to hold Brilinta as requested.

## 2021-04-20 ENCOUNTER — Ambulatory Visit: Payer: Managed Care, Other (non HMO) | Admitting: Interventional Cardiology

## 2021-04-22 LAB — COMPREHENSIVE METABOLIC PANEL
ALT: 55 IU/L — ABNORMAL HIGH (ref 0–44)
AST: 30 IU/L (ref 0–40)
Albumin/Globulin Ratio: 2.8 — ABNORMAL HIGH (ref 1.2–2.2)
Albumin: 4.4 g/dL (ref 3.8–4.8)
Alkaline Phosphatase: 99 IU/L (ref 44–121)
BUN/Creatinine Ratio: 13 (ref 10–24)
BUN: 18 mg/dL (ref 8–27)
Bilirubin Total: 0.7 mg/dL (ref 0.0–1.2)
CO2: 22 mmol/L (ref 20–29)
Calcium: 9.2 mg/dL (ref 8.6–10.2)
Chloride: 107 mmol/L — ABNORMAL HIGH (ref 96–106)
Creatinine, Ser: 1.42 mg/dL — ABNORMAL HIGH (ref 0.76–1.27)
Globulin, Total: 1.6 g/dL (ref 1.5–4.5)
Glucose: 92 mg/dL (ref 70–99)
Potassium: 4.7 mmol/L (ref 3.5–5.2)
Sodium: 143 mmol/L (ref 134–144)
Total Protein: 6 g/dL (ref 6.0–8.5)
eGFR: 54 mL/min/{1.73_m2} — ABNORMAL LOW (ref >59)

## 2021-04-23 ENCOUNTER — Telehealth: Payer: Self-pay | Admitting: *Deleted

## 2021-04-23 DIAGNOSIS — Z79899 Other long term (current) drug therapy: Secondary | ICD-10-CM

## 2021-04-23 DIAGNOSIS — R7401 Elevation of levels of liver transaminase levels: Secondary | ICD-10-CM

## 2021-04-23 DIAGNOSIS — Z0189 Encounter for other specified special examinations: Secondary | ICD-10-CM

## 2021-04-23 LAB — LIPID PANEL
Chol/HDL Ratio: 2.5 ratio (ref 0.0–5.0)
Cholesterol, Total: 122 mg/dL (ref 100–199)
HDL: 48 mg/dL (ref >39)
LDL Chol Calc (NIH): 60 mg/dL (ref 0–99)
Triglycerides: 70 mg/dL (ref 0–149)
VLDL Cholesterol Cal: 14 mg/dL (ref 5–40)

## 2021-04-23 NOTE — Telephone Encounter (Signed)
The patient has been notified of the result and verbalized understanding.  All questions (if any) were answered.  Pt states he will go to [facility] in 3 months to have his LFTs checked.  Order for LFTs placed under Lab Collect at [facility], and pt is aware to go and have this done in 3 months on 07/21/21. Order also released in the system. Will also mail lab requisition form to the pts mailing address on file.  Pt verbalized understanding and agrees with this plan.

## 2021-04-23 NOTE — Telephone Encounter (Signed)
-----   Message from Liliane Shi, PA-C sent at 04/23/2021  9:00 AM EST ----- Creatinine stable. K+ normal.  ALT mildly elevated but overall stable.  LDL optimal. PLAN:  -Continue current medications/treatment plan and follow up as scheduled.  -Repeat LFTs in 3 months Richardson Dopp, PA-C    04/23/2021 8:55 AM

## 2021-05-29 ENCOUNTER — Other Ambulatory Visit: Payer: Self-pay | Admitting: Cardiology

## 2021-06-09 ENCOUNTER — Telehealth: Payer: Self-pay | Admitting: Cardiovascular Disease

## 2021-06-09 NOTE — Telephone Encounter (Signed)
Left message for the pt to call the office to schedule a tele pre op appt 

## 2021-06-09 NOTE — Telephone Encounter (Signed)
Primary Cardiologist:Michael Burt Knack, MD ? ?Chart reviewed as part of pre-operative protocol coverage. Because of Walter Chapman's past medical history and time since last visit, he/she will require a virtual visit/telephone call in order to better assess preoperative cardiovascular risk. ? ?Pre-op covering staff: ?- Please contact patient, obtain consent, and schedule appointment  ? ?Per Dr. Burt Knack on 03/25/21, patient may hold Brilinta after June 2023, 1 year post DES to LAD and anterior STEMI. We will assess that he has no further cardiac symptoms during the telephone assessment.  ? ?Emmaline Life, NP-C ? ?  ?06/09/2021, 3:16 PM ?[place] ?[address] ?Office [phone] Fax [phone] ? ?

## 2021-06-09 NOTE — Telephone Encounter (Signed)
? ?  Pre-operative Risk Assessment  ?  ?Patient Name: Walter Chapman  ?DOB: 1955/07/23 ?MRN: [id number]  ? ?  ? ?Request for Surgical Clearance   ? ?Procedure:  prostate biopsy  ? ?Date of Surgery:  Clearance 08/13/21                              ?   ?Surgeon:  Dr. Link Snuffer ?Surgeon's Group or Practice Name:  [facility] ?Phone number:  [phone] ?Fax number:  [phone] ?  ?Type of Clearance Requested:   ?- Medical  ?- Pharmacy:  Hold Ticagrelor (Brilinta) 5-7 days ?  ?Type of Anesthesia:  None  ?  ?Additional requests/questions:   ? ?Signed, ?Selena Zobro   ?06/09/2021, 1:58 PM   ?

## 2021-06-10 ENCOUNTER — Telehealth: Payer: Self-pay | Admitting: *Deleted

## 2021-06-10 NOTE — Telephone Encounter (Signed)
Pt has been scheduled for a telephone appointment, 06/23/21 at 10:00, clearance will be addressed at that time.  ?

## 2021-06-10 NOTE — Telephone Encounter (Signed)
Pt has been scheduled for a televisit, 06/23/21, medications reconciled / consent on file. ? ?  ?Patient Consent for Virtual Visit  ? ? ?   ? ?KEELAN TRIPODI has provided verbal consent on 06/10/2021 for a virtual visit (video or telephone). ? ? ?CONSENT FOR VIRTUAL VISIT FOR:  Walter Chapman  ?By participating in this virtual visit I agree to the following: ? ?I hereby voluntarily request, consent and authorize [place] and its employed or contracted physicians, physician assistants, nurse practitioners or other licensed health care professionals (the Practitioner), to provide me with telemedicine health care services (the ?Services") as deemed necessary by the treating Practitioner. I acknowledge and consent to receive the Services by the Practitioner via telemedicine. I understand that the telemedicine visit will involve communicating with the Practitioner through live audiovisual communication technology and the disclosure of certain medical information by electronic transmission. I acknowledge that I have been given the opportunity to request an in-person assessment or other available alternative prior to the telemedicine visit and am voluntarily participating in the telemedicine visit. ? ?I understand that I have the right to withhold or withdraw my consent to the use of telemedicine in the course of my care at any time, without affecting my right to future care or treatment, and that the Practitioner or I may terminate the telemedicine visit at any time. I understand that I have the right to inspect all information obtained and/or recorded in the course of the telemedicine visit and may receive copies of available information for a reasonable fee.  I understand that some of the potential risks of receiving the Services via telemedicine include:  ?Delay or interruption in medical evaluation due to technological equipment failure or disruption; ?Information transmitted may not be sufficient (e.g. poor  resolution of images) to allow for appropriate medical decision making by the Practitioner; and/or  ?In rare instances, security protocols could fail, causing a breach of personal health information. ? ?Furthermore, I acknowledge that it is my responsibility to provide information about my medical history, conditions and care that is complete and accurate to the best of my ability. I acknowledge that Practitioner's advice, recommendations, and/or decision may be based on factors not within their control, such as incomplete or inaccurate data provided by me or distortions of diagnostic images or specimens that may result from electronic transmissions. I understand that the practice of medicine is not an exact science and that Practitioner makes no warranties or guarantees regarding treatment outcomes. I acknowledge that a copy of this consent can be made available to me via my patient portal ([place]), or I can request a printed copy by calling the office of [place].   ? ?I understand that my insurance will be billed for this visit.  ? ?I have read or had this consent read to me. ?I understand the contents of this consent, which adequately explains the benefits and risks of the Services being provided via telemedicine.  ?I have been provided ample opportunity to ask questions regarding this consent and the Services and have had my questions answered to my satisfaction. ?I give my informed consent for the services to be provided through the use of telemedicine in my medical care ? ? ? ?

## 2021-06-23 ENCOUNTER — Ambulatory Visit (INDEPENDENT_AMBULATORY_CARE_PROVIDER_SITE_OTHER): Payer: Managed Care, Other (non HMO) | Admitting: Physician Assistant

## 2021-06-23 DIAGNOSIS — I255 Ischemic cardiomyopathy: Secondary | ICD-10-CM

## 2021-06-23 DIAGNOSIS — Z01810 Encounter for preprocedural cardiovascular examination: Secondary | ICD-10-CM

## 2021-06-23 DIAGNOSIS — I2510 Atherosclerotic heart disease of native coronary artery without angina pectoris: Secondary | ICD-10-CM | POA: Diagnosis not present

## 2021-06-23 NOTE — Progress Notes (Addendum)
? ?Virtual Visit via Telephone Note  ? ?This visit type was conducted due to national recommendations for restrictions regarding the COVID-19 Pandemic (e.g. social distancing) in an effort to limit this patient's exposure and mitigate transmission in our community.  Due to his co-morbid illnesses, this patient is at least at moderate risk for complications without adequate follow up.  This format is felt to be most appropriate for this patient at this time.  The patient did not have access to video technology/had technical difficulties with video requiring transitioning to audio format only (telephone).  All issues noted in this document were discussed and addressed.  No physical exam could be performed with this format.  Please refer to the patient's chart for his  consent to telehealth for [facility]. ? ?Evaluation Performed:  Preoperative cardiovascular risk assessment ?_____________  ? ?Date:  06/23/2021  ? ?Patient ID:  Ethridge, Sollenberger August 23, 1955, MRN [id number] ?Patient Location:  ?Home ?Provider location:   ?Office ? ?Primary Care Provider:  [facility] [place] ?Primary Cardiologist:  Sherren Mocha, MD ? ?Chief Complaint  ?  ?66 y.o. y/o male with a h/o CAD, CLL, HTN, HLD and CKD, who is pending prostate biopsy, and presents today for telephonic preoperative cardiovascular risk assessment. ? ?Past Medical History  ?  ?Past Medical History:  ?Diagnosis Date  ? CAD in native artery   ? Cardiomyopathy, ischemic   ? CLL (chronic lymphocytic leukemia) ([place])   ? HLD (hyperlipidemia)   ? STEMI (ST elevation myocardial infarction) ([place]) PCI to LAD   ? ?Past Surgical History:  ?Procedure Laterality Date  ? CORONARY/GRAFT ACUTE MI REVASCULARIZATION N/A 08/12/2020  ? Procedure: Coronary/Graft Acute MI Revascularization;  Surgeon: Sherren Mocha, MD;  Location: [place] CV [facility];  Service: Cardiovascular;  Laterality: N/A;  ? LEFT HEART CATH AND CORONARY ANGIOGRAPHY N/A 08/12/2020  ? Procedure: LEFT  HEART CATH AND CORONARY ANGIOGRAPHY;  Surgeon: Sherren Mocha, MD;  Location: [place] CV [facility];  Service: Cardiovascular;  Laterality: N/A;  ? ? ?Allergies ? ?No Known Allergies ? ?History of Present Illness  ?  ?TWAN HARKIN is a 66 y.o. male who presents via audio/video conferencing for a telehealth visit today.  Pt was last seen in cardiology clinic on 12/24/20 by Richardson Dopp, PAC.  At that time CHIRSTOPHER IOVINO was doing well.  The patient is now pending prostate biopsy.  Since his last visit, he well.  ? ?The patient denies nausea, vomiting, fever, chest pain, palpitations, shortness of breath, orthopnea, PND, dizziness, syncope, cough, congestion, abdominal pain, hematochezia, melena, lower extremity edema. No exertional limitations.  ? ? ?Home Medications  ?  ?Prior to Admission medications   ?Medication Sig Start Date End Date Taking? Authorizing Provider  ?aspirin 81 MG chewable tablet Chew 1 tablet (81 mg total) by mouth daily. 08/15/20   Cheryln Manly, NP  ?atorvastatin (LIPITOR) 80 MG tablet TAKE 1 TABLET BY MOUTH DAILY 12/04/20   Sherren Mocha, MD  ?BRILINTA 90 MG TABS tablet TAKE 1 TABLET BY MOUTH TWICE DAILY 03/12/21   Sherren Mocha, MD  ?carvedilol (COREG) 6.25 MG tablet Take 1 tablet (6.25 mg total) by mouth 2 (two) times daily. 09/03/20   Kathyrn Drown D, NP  ?ezetimibe (ZETIA) 10 MG tablet Take 1 tablet (10 mg total) by mouth daily. 01/12/21 06/10/21  Richardson Dopp T, PA-C  ?FARXIGA 10 MG TABS tablet TAKE 1 TABLET(10 MG) BY MOUTH DAILY 12/10/20   Isaiah Serge, NP  ?multivitamin (ONE-A-DAY MEN'S) TABS  tablet Take 1 tablet by mouth daily with breakfast.    [provider]  ?nitroGLYCERIN (NITROSTAT) 0.4 MG SL tablet Place 1 tablet (0.4 mg total) under the tongue every 5 (five) minutes x 3 doses as needed for chest pain. 08/14/20   Cheryln Manly, NP  ?ZEGERID OTC 20-1100 MG CAPS capsule Take 1 capsule by mouth daily as needed (for indigestion).    [provider]  ? ? ?Physical Exam  ?  ?Vital Signs:  SOLACE WENDORFF does not have vital signs available for review today. ? ?Given telephonic nature of communication, physical exam is limited. ?AAOx3. NAD. Normal affect.  Speech and respirations are unlabored. ? ?Accessory Clinical Findings  ?  ?None ? ?Assessment & Plan  ?  ?1.  Preoperative Cardiovascular Risk Assessment: ? ?Given past medical history and time since last visit, based on ACC/AHA guidelines, RAYSHUN KANDLER would be at acceptable risk for the planned procedure without further cardiovascular testing.  ? ?Patient can hold Brilinta 5-7 days prior to procedure.  ? ?A copy of this note will be routed to requesting surgeon. ? ?Time:   ?Today, I have spent 6 minutes with the patient with telehealth technology discussing medical history, symptoms, and management plan.   ? ? ?Leanor Kail, [place] ? ?06/23/2021, 10:14 AM  ?

## 2021-07-30 DIAGNOSIS — C61 Malignant neoplasm of prostate: Secondary | ICD-10-CM

## 2021-07-30 HISTORY — DX: Malignant neoplasm of prostate: C61

## 2021-08-12 ENCOUNTER — Telehealth: Payer: Self-pay

## 2021-08-12 NOTE — Telephone Encounter (Signed)
   Pre-operative Risk Assessment    Patient Name: Walter Chapman  DOB: 07-21-1955 MRN: [id number]      Request for Surgical Clearance    Procedure:   Colonoscopy  Date of Surgery:  Clearance 08/27/21                                 Surgeon:  Dr. Irine Seal Group or Practice Name:  [facility], [place] Phone number:  [phone] Fax number:  [phone]   Type of Clearance Requested:   - Medical  - Pharmacy:  Hold Aspirin and Ticagrelor (Brilinta)     Type of Anesthesia:   Propofol   Additional requests/questions:    Signed, Jagar Lua   08/12/2021, 5:13 PM

## 2021-08-13 NOTE — Telephone Encounter (Signed)
Called patient to update his medical history. He is scheduled today for prostate biopsy and has been holding Brilinta for one week. He is planning to call Dr. Ulyses Amor office to reschedule his colonoscopy so that he can resume Brilinta for some time before holding again for another procedure. He is not having any cardiac symptoms. I advised that I will forward the clearance back to Dr. Benson Norway with this information. He thanked me for the call.  Emmaline Life, NP-C    08/13/2021, 9:54 AM [place] [address] Office [phone] Fax [phone]

## 2021-08-18 ENCOUNTER — Telehealth: Payer: Self-pay

## 2021-08-18 NOTE — Telephone Encounter (Signed)
Original clearance put in on 08/12/2021 because original date of procedure 08/27/2021 but now has new procedure date so putting in new clearance.     Pre-operative Risk Assessment    Patient Name: Walter Chapman  DOB: 1955-09-07 MRN: [id number]      Request for Surgical Clearance    Procedure:   colonoscopy  Date of Surgery:  Clearance 09/15/21                                 Surgeon:  Dr. Irine Seal Group or Practice Name:  [facility], [place] Phone number:  [phone] Fax number:  [phone]   Type of Clearance Requested:   - Medical  - Pharmacy:  Hold Aspirin and Ticagrelor (Brilinta)     Type of Anesthesia:   Propofol   Additional requests/questions:    Signed, Jaydah Stahle   08/18/2021, 4:53 PM

## 2021-08-19 NOTE — Telephone Encounter (Signed)
   Name: JOESIAH LONON  DOB: 1955/03/31  MRN: [id number]   Primary Cardiologist: Sherren Mocha, MD  Chart reviewed as part of pre-operative protocol coverage. Patient was contacted 08/19/2021 in reference to pre-operative risk assessment for pending surgery as outlined below.  LANTZ HERMANN was last seen on 12/24/20 by Richardson Dopp PAC.  Since that day, MYRICK MCNAIRY has done well. He can complete more than 4.0 METS without angina.   He may hold brilinta 5-7 days prior to colonoscopy. Given his hx of anterior STEMI treated with DES to proximal LAD 07/2020, we recommend continuation of ASA uninterrupted.   Therefore, based on ACC/AHA guidelines, the patient would be at acceptable risk for the planned procedure without further cardiovascular testing.   The patient was advised that if he develops new symptoms prior to surgery to contact our office to arrange for a follow-up visit, and he verbalized understanding.  I will route this recommendation to the requesting party via Epic fax function and remove from pre-op pool. Please call with questions.  Tami Lin Ashvin Adelson, PA 08/19/2021, 2:40 PM

## 2021-08-19 NOTE — Telephone Encounter (Signed)
Called to schedule pt for a televisit for surgical clearance. Pt states he just recently had one of those for a prostate biopsy, and was asking why we couldn't use that for this one, that he had to pay $27 for it and he really didn't want to have to do that again, if he didn't have to.  I advised him I would send it back and see.

## 2021-08-19 NOTE — Telephone Encounter (Signed)
    Name: Walter Chapman  DOB: Jul 09, 1955  MRN: [id number]  Primary Cardiologist: Sherren Mocha, MD   Preoperative team, please contact this patient and set up a phone call appointment for further preoperative risk assessment. Please obtain consent and complete medication review. Thank you for your help.  I confirm that guidance regarding antiplatelet and oral anticoagulation therapy has been completed and, if necessary, noted below.  Brilinta hold addressed previously.   Ledora Bottcher, PA 08/19/2021, 9:01 AM [place] [address] [place], [name] [address]

## 2021-08-25 ENCOUNTER — Other Ambulatory Visit (HOSPITAL_COMMUNITY): Payer: Self-pay | Admitting: Urology

## 2021-08-25 DIAGNOSIS — N403 Nodular prostate with lower urinary tract symptoms: Secondary | ICD-10-CM

## 2021-08-25 DIAGNOSIS — C61 Malignant neoplasm of prostate: Secondary | ICD-10-CM

## 2021-09-02 ENCOUNTER — Other Ambulatory Visit: Payer: Self-pay | Admitting: Cardiovascular Disease

## 2021-09-03 ENCOUNTER — Other Ambulatory Visit: Payer: Self-pay | Admitting: Cardiology

## 2021-09-03 ENCOUNTER — Other Ambulatory Visit: Payer: Self-pay

## 2021-09-03 MED ORDER — CARVEDILOL 6.25 MG PO TABS: ORAL_TABLET | ORAL | 3 refills | Status: DC

## 2021-09-03 NOTE — Telephone Encounter (Signed)
Pt's medication was sent to pt's pharmacy as requested. Confirmation received.  °

## 2021-09-04 DIAGNOSIS — C61 Malignant neoplasm of prostate: Secondary | ICD-10-CM | POA: Insufficient documentation

## 2021-09-04 NOTE — Progress Notes (Signed)
Radiation Oncology         [phone] ________________________________  Initial Outpatient Consultation  Name: Walter Chapman MRN: [id number]  Date: 09/10/2021  DOB: November 29, 1955  ON:GEXBM[facility]UXLK, [facility]  Crista Elliot, MD   REFERRING PHYSICIAN: Crista Elliot, MD  DIAGNOSIS: 66 y.o. gentleman with Stage T2a adenocarcinoma of the prostate with Gleason score of 4+3, and PSA of 16.5.    ICD-10-CM   1. Malignant neoplasm of prostate (HCC)  C61       HISTORY OF PRESENT ILLNESS: Walter Chapman is a 66 y.o. male with a diagnosis of prostate cancer. He was noted to have an elevated PSA of 15 on routine labs on 02/26/2021 by his primary care physician, Dr. Renne Crigler.  Accordingly, he was referred for evaluation in urology by Dr. Alvester Morin on 06/03/2021,  digital rectal examination was performed at that time revealing induration on the right.  A repeat PSA performed that day was further elevated at 16.5. The patient proceeded to transrectal ultrasound with 12 biopsies of the prostate on 08/13/2021.  The prostate volume measured 33.67 cc.  Out of 12 core biopsies, 9 were positive.  The maximum Gleason score was 4+3, and this was seen in the left mid, left mid lateral, right mid lateral, right apex and right apex lateral.  Additionally, Gleason 3+4 was seen in the left apex lateral, right base, right base lateral and right mid lateral.  He had a PSMA PET scan on 09/08/2021 for disease staging which showed Intense radiotracer activity within the RIGHT lobe of the prostate gland consistent primary  adenocarcinoma.  No convincing evidence of metastatic pelvic adenopathy. Very mild radiotracer uptake  within the LEFT external iliac lymph nodes and inguinal lymph nodes is favored benign activity. No evidence of distant nodal disease. No evidence of visceral metastasis or skeletal metastasis  The patient reviewed the biopsy results with his urologist and he has kindly been referred today for  discussion of potential radiation treatment options.  Of note, the patient had a recent AMI with angioplasty and stent placement last year.  His cardiologist is Dr. Tonny Bollman.  He also has a history of CLL, followed by Dr. Candise Che, but has never required any treatment.   PREVIOUS RADIATION THERAPY: No  PAST MEDICAL HISTORY:  Past Medical History:  Diagnosis Date   CAD in native artery    Cardiomyopathy, ischemic    CLL (chronic lymphocytic leukemia) (HCC)    HLD (hyperlipidemia)    STEMI (ST elevation myocardial infarction) (HCC) PCI to LAD       PAST SURGICAL HISTORY: Past Surgical History:  Procedure Laterality Date   CORONARY/GRAFT ACUTE MI REVASCULARIZATION N/A 08/12/2020   Procedure: Coronary/Graft Acute MI Revascularization;  Surgeon: Tonny Bollman, MD;  Location: [facility] INVASIVE CV LAB;  Service: Cardiovascular;  Laterality: N/A;   LEFT HEART CATH AND CORONARY ANGIOGRAPHY N/A 08/12/2020   Procedure: LEFT HEART CATH AND CORONARY ANGIOGRAPHY;  Surgeon: Tonny Bollman, MD;  Location: [facility] INVASIVE CV LAB;  Service: Cardiovascular;  Laterality: N/A;    FAMILY HISTORY:  Family History  Problem Relation Age of Onset   Coronary artery disease Brother 35       had CABG    SOCIAL HISTORY:  Social History   Socioeconomic History   Marital status: Married    Spouse name: Not on file   Number of children: Not on file   Years of education: Not on file   Highest education level: Not on file  Occupational History   Not on file  Tobacco Use   Smoking status: Former    Types: Cigarettes   Smokeless tobacco: Never  Substance and Sexual Activity   Alcohol use: Not on file   Drug use: Not on file   Sexual activity: Not on file  Other Topics Concern   Not on file  Social History Narrative   Not on file   Social Determinants of Health   Financial Resource Strain: Not on file  Food Insecurity: Not on file  Transportation Needs: Not on file  Physical Activity: Not on file   Stress: Not on file  Social Connections: Not on file  Intimate Partner Violence: Not on file    ALLERGIES: Patient has no known allergies.  MEDICATIONS:  Current Outpatient Medications  Medication Sig Dispense Refill   aspirin 81 MG chewable tablet Chew 1 tablet (81 mg total) by mouth daily. 90 tablet 2   atorvastatin (LIPITOR) 80 MG tablet TAKE 1 TABLET BY MOUTH DAILY 90 tablet 3   BRILINTA 90 MG TABS tablet TAKE 1 TABLET BY MOUTH TWICE DAILY 180 tablet 1   carvedilol (COREG) 6.25 MG tablet TAKE 1 TABLET(6.25 MG) BY MOUTH TWICE DAILY 180 tablet 3   ezetimibe (ZETIA) 10 MG tablet Take 1 tablet (10 mg total) by mouth daily. 90 tablet 3   FARXIGA 10 MG TABS tablet TAKE 1 TABLET(10 MG) BY MOUTH DAILY 90 tablet 3   multivitamin (ONE-A-DAY MEN'S) TABS tablet Take 1 tablet by mouth daily with breakfast.     nitroGLYCERIN (NITROSTAT) 0.4 MG SL tablet Place 1 tablet (0.4 mg total) under the tongue every 5 (five) minutes x 3 doses as needed for chest pain. 25 tablet 2   ZEGERID OTC 20-1100 MG CAPS capsule Take 1 capsule by mouth daily as needed (for indigestion).     No current facility-administered medications for this visit.      REVIEW OF SYSTEMS:  On review of systems, the patient reports that he is doing well overall. He denies any chest pain, shortness of breath, cough, fevers, chills, night sweats, unintended weight changes. He denies any bowel disturbances, and denies abdominal pain, nausea or vomiting. He denies any new musculoskeletal or joint aches or pains. His IPSS was Total Score: 22, indicating severe urinary symptoms (Reference 0-7 mild, 8-19 moderate, 20-35 severe).  His SHIM: 11, indicating he has moderate erectile dysfunction (Reference - 22-25 None, 17-21 Mild, 8-16 Moderate, 1-7 Severe). A complete review of systems is obtained and is otherwise negative.     PHYSICAL EXAM:  Wt Readings from Last 3 Encounters:  12/24/20 224 lb (101.6 kg)  11/20/20 227 lb 3.2 oz (103.1 kg)   11/19/20 227 lb 9.6 oz (103.2 kg)   Temp Readings from Last 3 Encounters:  11/20/20 98.2 F (36.8 C)  08/14/20 98.9 F (37.2 C) (Oral)   BP Readings from Last 3 Encounters:  12/24/20 116/74  11/20/20 124/75  11/19/20 (!) 142/88   Pulse Readings from Last 3 Encounters:  12/24/20 66  11/20/20 (!) 58  11/19/20 72    /10  In general this is a well appearing male in no acute distress. He's alert and oriented x4 and appropriate throughout the examination. Cardiopulmonary assessment is negative for acute distress, and he exhibits normal effort.     KPS = 100  100 - Normal; no complaints; no evidence of disease. 90   - Able to carry on normal activity; minor signs or symptoms of disease. 80   -  Normal activity with effort; some signs or symptoms of disease. 48   - Cares for self; unable to carry on normal activity or to do active work. 60   - Requires occasional assistance, but is able to care for most of his personal needs. 50   - Requires considerable assistance and frequent medical care. 40   - Disabled; requires special care and assistance. 30   - Severely disabled; hospital admission is indicated although death not imminent. 20   - Very sick; hospital admission necessary; active supportive treatment necessary. 10   - Moribund; fatal processes progressing rapidly. 0     - Dead  Karnofsky DA, Abelmann WH, Craver LS and Burchenal [facility] [phone]) The use of the nitrogen mustards in the palliative treatment of carcinoma: with particular reference to bronchogenic carcinoma Cancer 1 634-56  LABORATORY DATA:  Lab Results  Component Value Date   WBC 34.4 (H) 08/13/2020   HGB 14.5 08/13/2020   HCT 43.4 08/13/2020   MCV 93.1 08/13/2020   PLT 139 (L) 08/13/2020   Lab Results  Component Value Date   NA 143 04/22/2021   K 4.7 04/22/2021   CL 107 (H) 04/22/2021   CO2 22 04/22/2021   Lab Results  Component Value Date   ALT 55 (H) 04/22/2021   AST 30 04/22/2021   ALKPHOS 99  04/22/2021   BILITOT 0.7 04/22/2021     RADIOGRAPHY: No results found.    IMPRESSION/PLAN: 1. 66 y.o. gentleman with Stage T2a adenocarcinoma of the prostate with Gleason Score of 4+3, and PSA of 16.5. We discussed the patient's workup and outlined the nature of prostate cancer in this setting. The patient's T stage, Gleason's score, and PSA put him into the unfavorable intermediate risk group. Accordingly, he is eligible for a variety of potential treatment options including prostatectomy, brachytherapy, or ST-ADT concurrent with  5.5 weeks of external radiation. He is not felt to be an ideal surgical candidate given his recent cardiac history, need for chronic anticoagulation and history of CLL. We discussed the available radiation techniques, and focused on the details and logistics of delivery. We discussed and outlined the risks, benefits, short and long-term effects associated with radiotherapy and compared and contrasted these with prostatectomy.  We discussed the role of SpaceOAR gel in reducing the rectal toxicity associated with radiotherapy. We also detailed the role of ADT in the treatment of unfavorable intermediate risk prostate cancer and outlined the associated side effects that could be expected with this therapy.  He appears to have a good understanding of his disease and our treatment recommendations which are of curative intent.  He was encouraged to ask questions that were answered to his stated satisfaction.  At the conclusion of our conversation, the patient is interested in moving forward with ST-ADT (6 months) starting in July and then IMRT starting in September.  Given his acute MI, Orgovyx may be medically necessary.  I personally spent 60 minutes in this encounter including chart review, reviewing radiological studies, meeting face-to-face with the patient, entering orders and completing documentation.      Margaretmary Dys, MD  [facility]  Radiation Oncology Direct  Dial: [phone]  Fax: [phone] [email or web address]  Skype  LinkedIn

## 2021-09-07 NOTE — Progress Notes (Signed)
GU Location of Tumor / Histology: Prostate Ca  If Prostate Cancer, Gleason Score is (4 + 3) and PSA is (16.5 as of 05/2021)  Biopsies      Past/Anticipated interventions by urology, if any: NA  Past/Anticipated interventions by medical oncology, if any: NA  Weight changes, if any: {:18581}  IPSS: SHIM:  Bowel/Bladder complaints, if any: {:18581}   Nausea/Vomiting, if any: {:18581}  Pain issues, if any:  {:18581}  SAFETY ISSUES: Prior radiation? {:18581} Pacemaker/ICD? {:18581} Possible current pregnancy? Male Is the patient on methotrexate? No  Current Complaints / other details:

## 2021-09-08 ENCOUNTER — Encounter (HOSPITAL_COMMUNITY)
Admission: RE | Admit: 2021-09-08 | Discharge: 2021-09-08 | Disposition: A | Discharge status: Home / Self Care | Payer: Managed Care, Other (non HMO) | Source: Ambulatory Visit | Attending: Urology | Admitting: Urology

## 2021-09-08 ENCOUNTER — Other Ambulatory Visit (HOSPITAL_COMMUNITY): Payer: Managed Care, Other (non HMO)

## 2021-09-08 DIAGNOSIS — C61 Malignant neoplasm of prostate: Secondary | ICD-10-CM | POA: Insufficient documentation

## 2021-09-08 MED ORDER — PIFLIFOLASTAT F 18 (PYLARIFY) INJECTION
9.0000 | Freq: Once | INTRAVENOUS | Status: AC
  Administered 2021-09-08: 9.58 via INTRAVENOUS

## 2021-09-10 ENCOUNTER — Ambulatory Visit
Admission: RE | Admit: 2021-09-10 | Discharge: 2021-09-10 | Disposition: A | Discharge status: Home / Self Care | Payer: Managed Care, Other (non HMO) | Source: Ambulatory Visit | Attending: Radiation Oncology | Admitting: Radiation Oncology

## 2021-09-10 ENCOUNTER — Other Ambulatory Visit: Payer: Self-pay

## 2021-09-10 VITALS — BP 147/95 | HR 78 | Temp 98.3°F | Wt 231.2 lb

## 2021-09-10 DIAGNOSIS — C61 Malignant neoplasm of prostate: Secondary | ICD-10-CM

## 2021-09-10 DIAGNOSIS — I252 Old myocardial infarction: Secondary | ICD-10-CM | POA: Insufficient documentation

## 2021-09-10 DIAGNOSIS — I255 Ischemic cardiomyopathy: Secondary | ICD-10-CM | POA: Diagnosis not present

## 2021-09-10 DIAGNOSIS — I2510 Atherosclerotic heart disease of native coronary artery without angina pectoris: Secondary | ICD-10-CM | POA: Insufficient documentation

## 2021-09-10 DIAGNOSIS — Z87891 Personal history of nicotine dependence: Secondary | ICD-10-CM | POA: Insufficient documentation

## 2021-09-10 DIAGNOSIS — E785 Hyperlipidemia, unspecified: Secondary | ICD-10-CM | POA: Diagnosis not present

## 2021-09-10 DIAGNOSIS — Z7982 Long term (current) use of aspirin: Secondary | ICD-10-CM | POA: Diagnosis not present

## 2021-09-10 DIAGNOSIS — Z7902 Long term (current) use of antithrombotics/antiplatelets: Secondary | ICD-10-CM | POA: Diagnosis not present

## 2021-09-10 DIAGNOSIS — Z79899 Other long term (current) drug therapy: Secondary | ICD-10-CM | POA: Diagnosis not present

## 2021-09-10 DIAGNOSIS — Z7984 Long term (current) use of oral hypoglycemic drugs: Secondary | ICD-10-CM | POA: Diagnosis not present

## 2021-09-10 DIAGNOSIS — C9111 Chronic lymphocytic leukemia of B-cell type in remission: Secondary | ICD-10-CM | POA: Diagnosis not present

## 2021-09-10 NOTE — Progress Notes (Signed)
Introduced myself to the patient as the prostate nurse navigator.  He is here to discuss his radiation treatment options, and has decided to proceed with ST-ADT and IMRT.  I gave him my business card and asked him to call me with questions or concerns.  Verbalized understanding.

## 2021-10-07 ENCOUNTER — Other Ambulatory Visit: Payer: Self-pay | Admitting: Physician Assistant

## 2021-10-09 ENCOUNTER — Other Ambulatory Visit: Payer: Self-pay | Admitting: Urology

## 2021-10-12 ENCOUNTER — Telehealth: Payer: Self-pay | Admitting: *Deleted

## 2021-10-12 NOTE — Telephone Encounter (Signed)
CALLED PATIENT TO INFORM OF FID.Marland Kitchen MARKERS AND SPACE OAR PLACEMENT ON 12-30-21 AND HIS SIM APPT. ON 01-07-22 - ARRIVAL TIME- 8:45 AM @ [facility], INFORMED PATIENT TO ARRIVE WITH A FULL BLADDER AND AN EMPTY BOWEL, SPOKE WITH PATIENT AND HE IS AWARE OF THESE APPTS. AND THE INSTRUCTIONS

## 2021-10-26 NOTE — Progress Notes (Signed)
Financial advocate informed me that coverage will be reviewed once the plan of treatment including fractions is included, usually around [place].   RN updated patient on this information, no further needs at this time.

## 2021-11-27 ENCOUNTER — Other Ambulatory Visit: Payer: Self-pay | Admitting: Cardiovascular Disease

## 2021-12-01 ENCOUNTER — Other Ambulatory Visit: Payer: Self-pay | Admitting: Cardiology

## 2021-12-23 ENCOUNTER — Other Ambulatory Visit: Payer: Self-pay | Admitting: Cardiovascular Disease

## 2021-12-23 NOTE — Progress Notes (Signed)
Reviewing chart for pre-op interview for surgery scheduled @ [facility] on 12-29-2021 by Dr Gloriann Loan.  Noted pt is on brilinta and asa, no request or clearance in epic by his cardiologist , dr cooper.  Left message w/ Jeannene Patella, OR scheduler for Dr Gloriann Loan, requesting cardiac clearance.

## 2021-12-24 ENCOUNTER — Telehealth: Payer: Self-pay | Admitting: Cardiovascular Disease

## 2021-12-24 ENCOUNTER — Encounter (HOSPITAL_BASED_OUTPATIENT_CLINIC_OR_DEPARTMENT_OTHER): Payer: Self-pay | Admitting: Urology

## 2021-12-24 NOTE — Telephone Encounter (Signed)
   Pre-operative Risk Assessment    Patient Name: Walter Chapman  DOB: 13-Sep-1955 MRN: [id number]      Request for Surgical Clearance    Procedure:  gold seed and space oar placement for radiation  Date of Surgery:  Clearance 12/29/21                                 Surgeon:  Dr. Gloriann Loan Surgeon's Group or Practice Name:  [facility] Phone number:  [phone] Fax number:  [phone]   Type of Clearance Requested:   - Pharmacy:  Hold Aspirin and Ticagrelor (Brilinta) 5 days for both   Type of Anesthesia:  MAC   Additional requests/questions:    Signed, Selena Zobro   12/24/2021, 8:34 AM

## 2021-12-24 NOTE — Telephone Encounter (Signed)
   Patient Name: KYNG MATLOCK  DOB: [birth year] MRN: [id number]  Primary Cardiologist: Sherren Mocha, MD  Chart reviewed as part of pre-operative protocol coverage. Mr. Dilone has a hx of CAD with 08/12/20 STEMI and DES-LAD. Pharmacy clearance only. Per office protocols BURECH MCFARLAND may hold Brilinta 5-7 days prior to planned procedure. Ideally given history of coronary stenting would maintain Aspirin, but if necessary could hold 5 days prior at discretion of surgeon. Resume promptly post procedure as soon as deemed safe by surgeon.   I will route this recommendation to the requesting party via Epic fax function and remove from pre-op pool.  Please call with questions.  Loel Dubonnet, NP 12/24/2021, 8:47 AM

## 2021-12-25 ENCOUNTER — Encounter (HOSPITAL_BASED_OUTPATIENT_CLINIC_OR_DEPARTMENT_OTHER): Payer: Self-pay | Admitting: Urology

## 2021-12-25 NOTE — Progress Notes (Signed)
Spoke w/ via phone for pre-op interview--- pt Lab needs dos----   [employer], ekg            Lab results------ no COVID test -----patient states asymptomatic no test needed Arrive at ------- 0600 on 12-29-2021 NPO after MN NO Solid Food.  Clear liquids from MN until--- 0500 Med rec completed Medications to take morning of surgery ----- coreg, lipitor, zetia, zegerid, farxiga Diabetic medication -----  n/a Patient instructed no nail polish to be worn day of surgery Patient instructed to bring photo id and insurance card day of surgery Patient aware to have Driver (ride ) / caregiver for 24 hours after surgery --- [name] Patient Special Instructions -----  will do one fleet enema prior to surgery Pre-Op special Istructions ----- pt has cardiac clearance by Laurann Montana NP on 12-24-2021 in epic/ chart Patient verbalized understanding of instructions that were given at this phone interview. Patient denies shortness of breath, chest pain, fever, cough at this phone interview.    Anesthesia Review:  HTN;  CAD / STEMI 08-12-2020 s/p PCI and DES to LAD;  chronic systolic CHF;  ICM (last ef 10/ 2022 60-65%);  CKD 3 Pt denies cardiac s&s, sob, and no peripheral swelling.  Pt stated has not taken nitro since given rx 06/ 2022.  PCP: Dr Shelia Media Cardiologist : Dr Burt Knack (lov 12-24-2020 epic) Chest x-ray : 08-12-2020 EKG : 09-03-2020 Echo : 12-18-2020 Stress test: no Cardiac Cath :  08-12-2020 Activity level: denies sob w/ any activity  Blood Thinner/ Instructions Maryjane Hurter Dose: Brilinta ASA / Instructions/ Last Dose :  ASA '81mg'$  Pt stated was given instructions form Dr Gloriann Loan office to stop Brilinta/ ASA 5 days prior to surgery, stated last dose 12-24-2021

## 2021-12-28 NOTE — Anesthesia Preprocedure Evaluation (Signed)
Anesthesia Evaluation  Patient identified by MRN, date of birth, ID band Patient awake    Reviewed: Allergy & Precautions, NPO status , Patient's Chart, lab work & pertinent test results, reviewed documented beta blocker date and time   History of Anesthesia Complications Negative for: history of anesthetic complications  Airway Mallampati: II  TM Distance: >3 FB Neck ROM: Full    Dental  (+) Edentulous Upper, Edentulous Lower   Pulmonary former smoker,    Pulmonary exam normal        Cardiovascular hypertension, Pt. on home beta blockers and Pt. on medications + CAD and + Cardiac Stents  Normal cardiovascular exam   '22 TTE - EF 60-65%, mild LVH. Trivial MR    Neuro/Psych negative neurological ROS  negative psych ROS   GI/Hepatic Neg liver ROS, GERD  Medicated and Controlled,  Endo/Other   Obesity   Renal/GU CRFRenal disease    Prostate cancer     Musculoskeletal  (+) Arthritis ,   Abdominal   Peds  Hematology  On brilinta CLL    Anesthesia Other Findings   Reproductive/Obstetrics                            Anesthesia Physical Anesthesia Plan  ASA: 3  Anesthesia Plan: MAC   Post-op Pain Management: Tylenol PO (pre-op)*   Induction:   PONV Risk Score and Plan: 1 and Propofol infusion and Treatment may vary due to age or medical condition  Airway Management Planned: Natural Airway and Simple Face Mask  Additional Equipment: None  Intra-op Plan:   Post-operative Plan:   Informed Consent: I have reviewed the patients History and Physical, chart, labs and discussed the procedure including the risks, benefits and alternatives for the proposed anesthesia with the patient or authorized representative who has indicated his/her understanding and acceptance.       Plan Discussed with: CRNA and Anesthesiologist  Anesthesia Plan Comments:        Anesthesia Quick  Evaluation

## 2021-12-29 ENCOUNTER — Ambulatory Visit (HOSPITAL_BASED_OUTPATIENT_CLINIC_OR_DEPARTMENT_OTHER)
Admission: RE | Admit: 2021-12-29 | Discharge: 2021-12-29 | Disposition: A | Discharge status: Home / Self Care | Payer: Managed Care, Other (non HMO) | Source: Ambulatory Visit | Attending: Urology | Admitting: Urology

## 2021-12-29 ENCOUNTER — Encounter (HOSPITAL_BASED_OUTPATIENT_CLINIC_OR_DEPARTMENT_OTHER): Payer: Self-pay | Admitting: Urology

## 2021-12-29 ENCOUNTER — Ambulatory Visit (HOSPITAL_BASED_OUTPATIENT_CLINIC_OR_DEPARTMENT_OTHER): Payer: Managed Care, Other (non HMO) | Admitting: Anesthesiology

## 2021-12-29 ENCOUNTER — Encounter (HOSPITAL_BASED_OUTPATIENT_CLINIC_OR_DEPARTMENT_OTHER)
Admission: RE | Disposition: A | Discharge status: Home / Self Care | Payer: Self-pay | Source: Ambulatory Visit | Attending: Urology

## 2021-12-29 ENCOUNTER — Other Ambulatory Visit: Payer: Self-pay

## 2021-12-29 DIAGNOSIS — Z87891 Personal history of nicotine dependence: Secondary | ICD-10-CM | POA: Insufficient documentation

## 2021-12-29 DIAGNOSIS — K219 Gastro-esophageal reflux disease without esophagitis: Secondary | ICD-10-CM | POA: Diagnosis not present

## 2021-12-29 DIAGNOSIS — C9110 Chronic lymphocytic leukemia of B-cell type not having achieved remission: Secondary | ICD-10-CM | POA: Diagnosis not present

## 2021-12-29 DIAGNOSIS — E669 Obesity, unspecified: Secondary | ICD-10-CM | POA: Insufficient documentation

## 2021-12-29 DIAGNOSIS — C61 Malignant neoplasm of prostate: Secondary | ICD-10-CM | POA: Diagnosis present

## 2021-12-29 DIAGNOSIS — I129 Hypertensive chronic kidney disease with stage 1 through stage 4 chronic kidney disease, or unspecified chronic kidney disease: Secondary | ICD-10-CM

## 2021-12-29 DIAGNOSIS — Z6833 Body mass index (BMI) 33.0-33.9, adult: Secondary | ICD-10-CM | POA: Insufficient documentation

## 2021-12-29 DIAGNOSIS — I2510 Atherosclerotic heart disease of native coronary artery without angina pectoris: Secondary | ICD-10-CM | POA: Diagnosis not present

## 2021-12-29 DIAGNOSIS — N183 Chronic kidney disease, stage 3 unspecified: Secondary | ICD-10-CM | POA: Diagnosis not present

## 2021-12-29 DIAGNOSIS — Z01818 Encounter for other preprocedural examination: Secondary | ICD-10-CM

## 2021-12-29 DIAGNOSIS — Z955 Presence of coronary angioplasty implant and graft: Secondary | ICD-10-CM | POA: Insufficient documentation

## 2021-12-29 DIAGNOSIS — N189 Chronic kidney disease, unspecified: Secondary | ICD-10-CM

## 2021-12-29 HISTORY — DX: Gastro-esophageal reflux disease without esophagitis: K21.9

## 2021-12-29 HISTORY — DX: Mixed hyperlipidemia: E78.2

## 2021-12-29 HISTORY — DX: Personal history of peptic ulcer disease: Z87.11

## 2021-12-29 HISTORY — DX: Chronic kidney disease, stage 3 unspecified: N18.30

## 2021-12-29 HISTORY — DX: Essential (primary) hypertension: I10

## 2021-12-29 HISTORY — PX: GOLD SEED IMPLANT: SHX6343

## 2021-12-29 HISTORY — PX: SPACE OAR INSTILLATION: SHX6769

## 2021-12-29 HISTORY — DX: Unspecified osteoarthritis, unspecified site: M19.90

## 2021-12-29 HISTORY — DX: Long term (current) use of anticoagulants: Z79.01

## 2021-12-29 HISTORY — DX: Complete loss of teeth, unspecified cause, unspecified class: Z97.2

## 2021-12-29 HISTORY — DX: Complete loss of teeth, unspecified cause, unspecified class: K08.109

## 2021-12-29 HISTORY — DX: Unspecified symptoms and signs involving the genitourinary system: R39.9

## 2021-12-29 HISTORY — DX: Nocturia: R35.1

## 2021-12-29 LAB — POCT I-STAT, CHEM 8
BUN: 25 mg/dL — ABNORMAL HIGH (ref 8–23)
Calcium, Ion: 1.23 mmol/L (ref 1.15–1.40)
Chloride: 107 mmol/L (ref 98–111)
Creatinine, Ser: 1.5 mg/dL — ABNORMAL HIGH (ref 0.61–1.24)
Glucose, Bld: 95 mg/dL (ref 70–99)
HCT: 43 % (ref 39.0–52.0)
Hemoglobin: 14.6 g/dL (ref 13.0–17.0)
Potassium: 4.1 mmol/L (ref 3.5–5.1)
Sodium: 142 mmol/L (ref 135–145)
TCO2: 22 mmol/L (ref 22–32)

## 2021-12-29 SURGERY — INSERTION, GOLD SEEDS
Anesthesia: Monitor Anesthesia Care | Site: Prostate

## 2021-12-29 MED ORDER — OXYCODONE HCL 5 MG PO TABS: 5.0000 mg | ORAL_TABLET | Freq: Once | ORAL | Status: DC | PRN

## 2021-12-29 MED ORDER — CEFAZOLIN SODIUM-DEXTROSE 2-4 GM/100ML-% IV SOLN
INTRAVENOUS | Status: AC
  Filled 2021-12-29: qty 100

## 2021-12-29 MED ORDER — PROPOFOL 10 MG/ML IV BOLUS
INTRAVENOUS | Status: DC | PRN
  Administered 2021-12-29: 20 mg via INTRAVENOUS

## 2021-12-29 MED ORDER — OXYCODONE HCL 5 MG/5ML PO SOLN: 5.0000 mg | Freq: Once | ORAL | Status: DC | PRN

## 2021-12-29 MED ORDER — LIDOCAINE 2% (20 MG/ML) 5 ML SYRINGE
INTRAMUSCULAR | Status: DC | PRN
  Administered 2021-12-29: 50 mg via INTRAVENOUS

## 2021-12-29 MED ORDER — CEFAZOLIN SODIUM-DEXTROSE 2-4 GM/100ML-% IV SOLN
2.0000 g | INTRAVENOUS | Status: AC
  Administered 2021-12-29: 2 g via INTRAVENOUS

## 2021-12-29 MED ORDER — ONDANSETRON HCL 4 MG/2ML IJ SOLN
INTRAMUSCULAR | Status: DC | PRN
  Administered 2021-12-29: 4 mg via INTRAVENOUS

## 2021-12-29 MED ORDER — FENTANYL CITRATE (PF) 100 MCG/2ML IJ SOLN
25.0000 ug | INTRAMUSCULAR | Status: DC | PRN
  Administered 2021-12-29: 50 ug via INTRAVENOUS

## 2021-12-29 MED ORDER — ACETAMINOPHEN 500 MG PO TABS
1000.0000 mg | ORAL_TABLET | Freq: Once | ORAL | Status: AC
  Administered 2021-12-29: 1000 mg via ORAL

## 2021-12-29 MED ORDER — FENTANYL CITRATE (PF) 250 MCG/5ML IJ SOLN
INTRAMUSCULAR | Status: DC | PRN
  Administered 2021-12-29 (×2): 25 ug via INTRAVENOUS

## 2021-12-29 MED ORDER — ONDANSETRON HCL 4 MG/2ML IJ SOLN: 4.0000 mg | Freq: Once | INTRAMUSCULAR | Status: DC | PRN

## 2021-12-29 MED ORDER — FENTANYL CITRATE (PF) 100 MCG/2ML IJ SOLN
INTRAMUSCULAR | Status: AC
  Filled 2021-12-29: qty 2

## 2021-12-29 MED ORDER — PROPOFOL 500 MG/50ML IV EMUL
INTRAVENOUS | Status: DC | PRN
  Administered 2021-12-29: 150 ug/kg/min via INTRAVENOUS

## 2021-12-29 MED ORDER — ACETAMINOPHEN 500 MG PO TABS
ORAL_TABLET | ORAL | Status: AC
  Filled 2021-12-29: qty 1

## 2021-12-29 MED ORDER — SODIUM CHLORIDE 0.9 % IV SOLN: INTRAVENOUS | Status: DC

## 2021-12-29 MED ORDER — SODIUM CHLORIDE (PF) 0.9 % IJ SOLN
INTRAMUSCULAR | Status: DC | PRN
  Administered 2021-12-29: 10 mL
  Administered 2021-12-29: 10 mL via INTRAVENOUS

## 2021-12-29 SURGICAL SUPPLY — 26 items
BLADE CLIPPER SENSICLIP SURGIC (BLADE) ×2 IMPLANT
CNTNR URN SCR LID CUP LEK RST (MISCELLANEOUS) ×2 IMPLANT
CONT SPEC 4OZ STRL OR WHT (MISCELLANEOUS) ×2
COVER BACK TABLE 60X90IN (DRAPES) ×2 IMPLANT
DRSG TEGADERM 4X4.75 (GAUZE/BANDAGES/DRESSINGS) ×2 IMPLANT
DRSG TEGADERM 8X12 (GAUZE/BANDAGES/DRESSINGS) ×2 IMPLANT
GAUZE SPONGE 4X4 12PLY STRL (GAUZE/BANDAGES/DRESSINGS) ×2 IMPLANT
GAUZE SPONGE 4X4 12PLY STRL LF (GAUZE/BANDAGES/DRESSINGS) IMPLANT
GLOVE BIO SURGEON STRL SZ7.5 (GLOVE) ×2 IMPLANT
GLOVE ECLIPSE 8.0 STRL XLNG CF (GLOVE) ×2 IMPLANT
GLOVE SURG ORTHO 8.5 STRL (GLOVE) ×2 IMPLANT
IMPL SPACEOAR VUE SYSTEM (Spacer) ×2 IMPLANT
IMPLANT SPACEOAR VUE SYSTEM (Spacer) ×2 IMPLANT
KIT TURNOVER CYSTO (KITS) ×2 IMPLANT
MARKER GOLD PRELOAD 1.2X3 (Urological Implant) ×2 IMPLANT
MARKER SKIN DUAL TIP RULER LAB (MISCELLANEOUS) ×2 IMPLANT
NDL SPNL 22GX3.5 QUINCKE BK (NEEDLE) IMPLANT
NEEDLE SPNL 22GX3.5 QUINCKE BK (NEEDLE) IMPLANT
SEED GOLD PRELOAD 1.2X3 (Urological Implant) ×6 IMPLANT
SHEATH ULTRASOUND LF (SHEATH) IMPLANT
SHEATH ULTRASOUND LTX NONSTRL (SHEATH) IMPLANT
SURGILUBE 2OZ TUBE FLIPTOP (MISCELLANEOUS) ×2 IMPLANT
SYR 10ML LL (SYRINGE) IMPLANT
SYR CONTROL 10ML LL (SYRINGE) ×2 IMPLANT
TOWEL OR 17X26 10 PK STRL BLUE (TOWEL DISPOSABLE) ×2 IMPLANT
UNDERPAD 30X36 HEAVY ABSORB (UNDERPADS AND DIAPERS) ×2 IMPLANT

## 2021-12-29 NOTE — Discharge Instructions (Addendum)
Restart Brilinta in 2 days, Thursday   Diet Resume your usual diet when you return home. To keep your bowels moving easily and softly, drink prune, apple and cranberry juice at room temperature. You may also take a stool softener, such as Colace, which is available without prescription at local pharmacies. Daily activities No driving or heavy lifting for at least two days after the implant. No bike riding, horseback riding or riding lawn mowers for the first month after the implant. Any strenuous physical activity should be approved by your doctor before you resume it. Sexual relations You may resume sexual relations two weeks after the procedure. Your semen may be dark brown or black; this is normal and is related bleeding that may have occurred during the implant. Postoperative swelling Expect swelling and bruising of the scrotum and perineum (the area between the scrotum and anus). Both the swelling and the bruising should resolve in l or 2 weeks. Ice packs and over- the-counter medications such as Tylenol, Advil or Aleve may lessen your discomfort. Postoperative urination Most men experience burning on urination and/or urinary frequency. If this becomes bothersome, contact your Urologist.  Medication can be prescribed to relieve these problems.  It is normal to have some blood in your urine for a few days after the implant.  Contact your doctor for Temperature greater than 101 F Increasing pain Inability to urinate       No acetaminophen/Tylenol until after 12:30 pm today if needed.      Post Anesthesia Home Care Instructions  Activity: Get plenty of rest for the remainder of the day. A responsible individual must stay with you for 24 hours following the procedure.  For the next 24 hours, DO NOT: -Drive a car -Paediatric nurse -Drink alcoholic beverages -Take any medication unless instructed by your physician -Make any legal decisions or sign important  papers.  Meals: Start with liquid foods such as gelatin or soup. Progress to regular foods as tolerated. Avoid greasy, spicy, heavy foods. If nausea and/or vomiting occur, drink only clear liquids until the nausea and/or vomiting subsides. Call your physician if vomiting continues.  Special Instructions/Symptoms: Your throat may feel dry or sore from the anesthesia or the breathing tube placed in your throat during surgery. If this causes discomfort, gargle with warm salt water. The discomfort should disappear within 24 hours.

## 2021-12-29 NOTE — H&P (Signed)
H&P  Chief Complaint: Prostate cancer  History of Present Illness: 66 year old male with prostate cancer presents for marker placement and SpaceOAR.  Past Medical History:  Diagnosis Date   Anticoagulated    brilinta and asa-- managed by cardiology   Arthritis    shoulders   CAD in native artery 07/2020   cardiologist--- dr Burt Knack;  hx STEMI 08-12-2020 ,  cath w/ PCI and DES to proxLAD, mLCX 50%   Cardiomyopathy, ischemic    06/ 2022  ef 35-45%;   10/ 2022 ef 60-65%  (followed by cardiology)   CKD (chronic kidney disease), stage III ([place])    CLL (chronic lymphocytic leukemia) ([place]) 2011   oncologist--- dr Irene Limbo;   no treatment since dx approx 2011   Full dentures    GERD (gastroesophageal reflux disease)    History of gastric ulcer    remote hx   History of ST elevation myocardial infarction (STEMI) 08/12/2020   s/p PCI w/ stent   Hyperlipidemia, mixed    Hypertension    Malignant neoplasm prostate ([place]) 07/2021   urologist--- dr Jacobus Colvin/  radiation oncologist--- dr Tammi Klippel;  dx 06/ 2023, Gleason 4+3   Nocturia    S/P drug eluting coronary stent placement 08/12/2020   x1 to prox LAD   Past Surgical History:  Procedure Laterality Date   CORONARY/GRAFT ACUTE MI REVASCULARIZATION N/A 08/12/2020   Procedure: Coronary/Graft Acute MI Revascularization;  Surgeon: Sherren Mocha, MD;  Location: [facility];  Service: Cardiovascular;  Laterality: N/A;   LEFT HEART CATH AND CORONARY ANGIOGRAPHY N/A 08/12/2020   Procedure: LEFT HEART CATH AND CORONARY ANGIOGRAPHY;  Surgeon: Sherren Mocha, MD;  Location: [facility];  Service: Cardiovascular;  Laterality: N/A;   TONSILLECTOMY     age 66    Home Medications:  Medications Prior to Admission  Medication Sig Dispense Refill Last Dose   aspirin 81 MG chewable tablet Chew 1 tablet (81 mg total) by mouth daily. 90 tablet 2 Past Week   atorvastatin (LIPITOR) 80 MG tablet TAKE 1 TABLET BY MOUTH DAILY (Patient taking  differently: Take 80 mg by mouth daily.) 90 tablet 3 12/29/2021 at 0502   BRILINTA 90 MG TABS tablet TAKE 1 TABLET BY MOUTH TWICE DAILY (Patient taking differently: Take 90 mg by mouth 2 (two) times daily.) 180 tablet 1 Past Week   carvedilol (COREG) 6.25 MG tablet TAKE 1 TABLET(6.25 MG) BY MOUTH TWICE DAILY (Patient taking differently: Take 6.25 mg by mouth. TAKE 1 TABLET(6.25 MG) BY MOUTH TWICE DAILY) 180 tablet 3 12/29/2021 at 0502   ezetimibe (ZETIA) 10 MG tablet TAKE 1 TABLET(10 MG) BY MOUTH DAILY (Patient taking differently: Take 10 mg by mouth daily.) 90 tablet 2 12/28/2021   FARXIGA 10 MG TABS tablet TAKE 1 TABLET(10 MG) BY MOUTH DAILY (Patient taking differently: Take 10 mg by mouth daily.) 90 tablet 3 12/29/2021 at 0502   multivitamin (ONE-A-DAY MEN'S) TABS tablet Take 1 tablet by mouth daily with breakfast.   Past Month   ZEGERID OTC 20-1100 MG CAPS capsule Take 1 capsule by mouth daily as needed (for indigestion).   12/29/2021 at 0502   nitroGLYCERIN (NITROSTAT) 0.4 MG SL tablet Place 1 tablet (0.4 mg total) under the tongue every 5 (five) minutes x 3 doses as needed for chest pain. 25 tablet 2 Unknown   Allergies: No Known Allergies  Family History  Problem Relation Age of Onset   Coronary artery disease Brother 9       had CABG  Social History:  reports that he quit smoking about 28 years ago. His smoking use included cigarettes. He has never used smokeless tobacco. He reports that he does not currently use alcohol. He reports that he does not use drugs.  ROS: A complete review of systems was performed.  All systems are negative except for pertinent findings as noted. ROS   Physical Exam:  Vital signs in last 24 hours: Temp:  [97.6 F (36.4 C)] 97.6 F (36.4 C) (10/31 0622) Pulse Rate:  [60] 60 (10/31 0622) Resp:  [17] 17 (10/31 0622) BP: (123)/(82) 123/82 (10/31 0622) SpO2:  [100 %] 100 % (10/31 0622) Weight:  [106.8 kg] 106.8 kg (10/31 0622) General:  Alert and  oriented, No acute distress HEENT: Normocephalic, atraumatic Neck: No JVD or lymphadenopathy Cardiovascular: Regular rate and rhythm Lungs: Regular rate and effort Abdomen: Soft, nontender, nondistended, no abdominal masses Back: No CVA tenderness Extremities: No edema Neurologic: Grossly intact  Laboratory Data:  No results found for this or any previous visit (from the past 24 hour(s)). No results found for this or any previous visit (from the past 240 hour(s)). Creatinine: No results for input(s): "CREATININE" in the last 168 hours.  Impression/Assessment:  Prostate cancer  Plan:  Proceed with marker placement and SpaceOAR.  Risk benefits discussed including but not limited to bleeding, infection, injury to surrounding structures, need for additional procedures, rectal pain and discomfort, rectal ulceration, rectal perforation among other imponderables.  Marton Redwood, III 12/29/2021, 7:25 AM

## 2021-12-29 NOTE — Op Note (Signed)
Preoperative diagnosis: adenocarcinoma of the prostate   Postoperative diagnosis: adenocarcinoma of the prostate  Procedure: 1) Placement of fiducial markers into prostate                    2) Insertion of SpaceOAR hydrogel   Surgeon: Link Snuffer, M.D.  Anesthesia: MAC sedation  EBL: Minimal  Complications: None  Indication: Walter Chapman is a 66 y.o. gentleman with clinically localized prostate cancer. After discussing management options for treatment, he elected to proceed with radiotherapy. He presents today for the above procedures. The potential risks, complications, alternative options, and expected recovery course have been discussed in detail with the patient and he has provided informed consent to proceed.  Description of procedure: The patient was administered preoperative antibiotics, placed in the dorsal lithotomy position, and prepped and draped in the usual sterile fashion. Next, transrectal ultrasonography was utilized to visualize the prostate.  The perineum was anesthetized with quarter percent Marcaine. Three gold fiducial markers were then placed into the prostate via transperineal needles under ultrasound guidance at the right apex, right base, and left mid gland under direct ultrasound guidance. A site in the midline was then selected on the perineum for placement of an 18 g needle with saline. The needle was advanced above the rectum and below Denonvillier's fascia to the mid gland and confirmed to be in the midline on transverse imaging. One cc of saline was injected confirming appropriate expansion of this space. A total of 5 cc of saline was then injected to open the space further bilaterally. The saline syringe was then removed and the SpaceOAR hydrogel was injected with good distribution bilaterally. He tolerated the procedure well and without complications. He was given a voiding trial prior to discharge from the PACU.

## 2021-12-29 NOTE — Anesthesia Postprocedure Evaluation (Signed)
Anesthesia Post Note  Patient: Walter Chapman  Procedure(s) Performed: GOLD SEED IMPLANT (Prostate) SPACE OAR INSTILLATION (Perineum)     Patient location during evaluation: PACU Anesthesia Type: MAC Level of consciousness: awake and alert Pain management: pain level controlled Vital Signs Assessment: post-procedure vital signs reviewed and stable Respiratory status: spontaneous breathing, nonlabored ventilation and respiratory function stable Cardiovascular status: stable and blood pressure returned to baseline Anesthetic complications: no   No notable events documented.  Last Vitals:  Vitals:   12/29/21 0900 12/29/21 0915  BP: 137/76 124/81  Pulse: (!) 51 (!) 50  Resp: 10 (!) 8  Temp:    SpO2: 100% 94%    Last Pain:  Vitals:   12/29/21 0915  TempSrc:   PainSc: [place]

## 2021-12-29 NOTE — Transfer of Care (Signed)
Immediate Anesthesia Transfer of Care Note  Patient: Walter Chapman  Procedure(s) Performed: GOLD SEED IMPLANT (Prostate) SPACE OAR INSTILLATION (Perineum)  Patient Location: PACU  Anesthesia Type:MAC  Level of Consciousness: drowsy  Airway & Oxygen Therapy: Patient Spontanous Breathing and Patient connected to face mask oxygen  Post-op Assessment: Report given to RN and Post -op Vital signs reviewed and stable  Post vital signs: Reviewed and stable  Last Vitals:  Vitals Value Taken Time  BP 108/73 12/29/21 0845  Temp    Pulse 66 12/29/21 0846  Resp 10 12/29/21 0846  SpO2 96 % 12/29/21 0846  Vitals shown include unvalidated device data.  Last Pain:  Vitals:   12/29/21 0622  TempSrc: Oral  PainSc: 0-No pain      Patients Stated Pain Goal: 5 (90/30/14 9969)  Complications: No notable events documented.

## 2021-12-30 ENCOUNTER — Encounter (HOSPITAL_BASED_OUTPATIENT_CLINIC_OR_DEPARTMENT_OTHER): Payer: Self-pay | Admitting: Urology

## 2021-12-31 ENCOUNTER — Ambulatory Visit: Payer: Managed Care, Other (non HMO) | Attending: Physician Assistant | Admitting: Physician Assistant

## 2021-12-31 ENCOUNTER — Encounter: Payer: Self-pay | Admitting: Physician Assistant

## 2021-12-31 ENCOUNTER — Ambulatory Visit: Payer: Managed Care, Other (non HMO) | Admitting: Radiation Oncology

## 2021-12-31 VITALS — BP 114/64 | HR 76 | Ht 70.0 in | Wt 240.0 lb

## 2021-12-31 DIAGNOSIS — I10 Essential (primary) hypertension: Secondary | ICD-10-CM | POA: Diagnosis not present

## 2021-12-31 DIAGNOSIS — I2510 Atherosclerotic heart disease of native coronary artery without angina pectoris: Secondary | ICD-10-CM

## 2021-12-31 DIAGNOSIS — I255 Ischemic cardiomyopathy: Secondary | ICD-10-CM

## 2021-12-31 DIAGNOSIS — E782 Mixed hyperlipidemia: Secondary | ICD-10-CM | POA: Diagnosis not present

## 2021-12-31 NOTE — Progress Notes (Signed)
Cardiology Office Note:    Date:  12/31/2021   ID:  Walter Chapman, DOB 1955-04-15, MRN [id number]  PCP:  Deland Pretty, MD  [facility] Cardiologist:  Sherren Mocha, MD  [place] Electrophysiologist:  None   Chief Complaint: yearly follow up   History of Present Illness:    Walter Chapman is a 66 y.o. male with a hx of CAD, ICM with improved EF, CLL, HTN, HLD, CKD and prostate cancer s/p marker placement and SpaceOAR seen for follow up.   Coronary artery disease  Ant STEMI 6/22 s/p DES to pLAD Ischemic CM Echocardiogram 6/22: EF 35-40  Echocardiogram 10/22: EF 60-65  Seen for follow up.  Under some pain after seed implantation for prostate cancer. Has hematuria as expected.   He has restarted his Brilinta today.  He denies chest pain, shortness of breath, orthopnea, PND, syncope, lower extremity edema or melena.  He walks on treadmill for 20 to 30 minutes each day without any issue.   Past Medical History:  Diagnosis Date   Anticoagulated    brilinta and asa-- managed by cardiology   Arthritis    shoulders   CAD in native artery 07/2020   cardiologist--- dr Burt Knack;  hx STEMI 08-12-2020 ,  cath w/ PCI and DES to proxLAD, mLCX 50%   Cardiomyopathy, ischemic    06/ 2022  ef 35-45%;   10/ 2022 ef 60-65%  (followed by cardiology)   CKD (chronic kidney disease), stage III (Walter Chapman)    CLL (chronic lymphocytic leukemia) ([place]) 2011   oncologist--- dr Irene Limbo;   no treatment since dx approx 2011   Full dentures    GERD (gastroesophageal reflux disease)    History of gastric ulcer    remote hx   History of ST elevation myocardial infarction (STEMI) 08/12/2020   s/p PCI w/ stent   Hyperlipidemia, mixed    Hypertension    Malignant neoplasm prostate ([place]) 07/2021   urologist--- dr bell/  radiation oncologist--- dr Tammi Klippel;  dx 06/ 2023, Gleason 4+3   Nocturia    S/P drug eluting coronary stent placement 08/12/2020   x1 to prox LAD    Past Surgical History:  Procedure  Laterality Date   CORONARY/GRAFT ACUTE MI REVASCULARIZATION N/A 08/12/2020   Procedure: Coronary/Graft Acute MI Revascularization;  Surgeon: Sherren Mocha, MD;  Location: [facility];  Service: Cardiovascular;  Laterality: N/A;   GOLD SEED IMPLANT N/A 12/29/2021   Procedure: GOLD SEED IMPLANT;  Surgeon: Lucas Mallow, MD;  Location: [facility];  Service: Urology;  Laterality: N/A;  30 MINS FOR CASE   LEFT HEART CATH AND CORONARY ANGIOGRAPHY N/A 08/12/2020   Procedure: LEFT HEART CATH AND CORONARY ANGIOGRAPHY;  Surgeon: Sherren Mocha, MD;  Location: [place] [facility];  Service: Cardiovascular;  Laterality: N/A;   SPACE OAR INSTILLATION N/A 12/29/2021   Procedure: SPACE OAR INSTILLATION;  Surgeon: Lucas Mallow, MD;  Location: [facility];  Service: Urology;  Laterality: N/A;   TONSILLECTOMY     age 31    Current Medications: Current Meds  Medication Sig   aspirin 81 MG chewable tablet Chew 1 tablet (81 mg total) by mouth daily.   atorvastatin (LIPITOR) 80 MG tablet TAKE 1 TABLET BY MOUTH DAILY (Patient taking differently: Take 80 mg by mouth daily.)   BRILINTA 90 MG TABS tablet TAKE 1 TABLET BY MOUTH TWICE DAILY (Patient taking differently: Take 90 mg by mouth 2 (two) times daily.)  carvedilol (COREG) 6.25 MG tablet TAKE 1 TABLET(6.25 MG) BY MOUTH TWICE DAILY (Patient taking differently: Take 6.25 mg by mouth. TAKE 1 TABLET(6.25 MG) BY MOUTH TWICE DAILY)   ezetimibe (ZETIA) 10 MG tablet TAKE 1 TABLET(10 MG) BY MOUTH DAILY (Patient taking differently: Take 10 mg by mouth daily.)   FARXIGA 10 MG TABS tablet TAKE 1 TABLET(10 MG) BY MOUTH DAILY (Patient taking differently: Take 10 mg by mouth daily.)   multivitamin (ONE-A-DAY MEN'S) TABS tablet Take 1 tablet by mouth daily with breakfast.   nitroGLYCERIN (NITROSTAT) 0.4 MG SL tablet Place 1 tablet (0.4 mg total) under the tongue every 5 (five) minutes x 3 doses as needed for chest pain.    ORGOVYX 120 MG TABS Take 1 tablet by mouth daily.   OVER THE COUNTER MEDICATION Take 2 capsules by mouth in the morning, at noon, and at bedtime. AZO CRANBERRY BLADDER HEALTH SUPPLEMENT.   tamsulosin (FLOMAX) 0.4 MG CAPS capsule Take 0.4 mg by mouth daily.   ZEGERID OTC 20-1100 MG CAPS capsule Take 1 capsule by mouth daily as needed (for indigestion).     Allergies:   Patient has no known allergies.   Social History   Socioeconomic History   Marital status: Married    Spouse name: Not on file   Number of children: Not on file   Years of education: Not on file   Highest education level: Not on file  Occupational History   Not on file  Tobacco Use   Smoking status: Former    Years: 2.00    Types: Cigarettes    Quit date: 73    Years since quitting: 28.8   Smokeless tobacco: Never  Vaping Use   Vaping Use: Former   Devices: quit approx 2017  Substance and Sexual Activity   Alcohol use: Not Currently   Drug use: Never   Sexual activity: Not on file  Other Topics Concern   Not on file  Social History Narrative   Not on file   Social Determinants of Health   Financial Resource Strain: Not on file  Food Insecurity: Not on file  Transportation Needs: Not on file  Physical Activity: Not on file  Stress: Not on file  Social Connections: Not on file     Family History: The patient's family history includes Coronary artery disease (age of onset: 60) in his brother.    ROS:   Please see the history of present illness.    All other systems reviewed and are negative.   EKGs/Labs/Other Studies Reviewed:    The following studies were reviewed today:  Echo 11/2020 1. Left ventricular ejection fraction, by estimation, is 60 to 65%. Left  ventricular ejection fraction by 3D volume is 61 %. The left ventricle has  normal function. The left ventricle has no regional wall motion  abnormalities. There is mild left  ventricular hypertrophy. Left ventricular diastolic  parameters were  normal.   2. Right ventricular systolic function is normal. The right ventricular  size is normal. There is normal pulmonary artery systolic pressure. The  estimated right ventricular systolic pressure is 16.1 mmHg.   3. The mitral valve is grossly normal. Trivial mitral valve  regurgitation.   4. The aortic valve is tricuspid. Aortic valve regurgitation is not  visualized.   5. The inferior vena cava is dilated in size with >50% respiratory  variability, suggesting right atrial pressure of 8 mmHg.   Comparison(s): Changes from prior study are noted. 11/13/2020: LVEF 35-40%  with LAD territory [facility].   Coronary/Graft Acute MI Revascularization  07/2020  LEFT HEART CATH AND CORONARY ANGIOGRAPHY   Conclusion  1.  Subtotal thrombotic occlusion of the proximal LAD, treated successfully with primary PCI using a 4.0 x 16 mm Synergy DES 2.  Moderate 50% mid left circumflex stenosis with TIMI-3 flow 3.  Widely patent, dominant RCA with no significant obstruction 4.  Elevated LVEDP consistent with acute systolic heart failure in the setting of acute myocardial infarction  Diagnostic Dominance: Right  Intervention      EKG:  EKG is not  ordered today.   Recent Labs: 04/22/2021: ALT 55 12/29/2021: BUN 25; Creatinine, Ser 1.50; Hemoglobin 14.6; Potassium 4.1; Sodium 142  Recent Lipid Panel    Component Value Date/Time   CHOL 122 04/22/2021 0945   TRIG 70 04/22/2021 0945   HDL 48 04/22/2021 0945   CHOLHDL 2.5 04/22/2021 0945   CHOLHDL 4.5 08/13/2020 0145   VLDL 31 08/13/2020 0145   LDLCALC 60 04/22/2021 0945     Physical Exam:    VS:  BP 114/64   Pulse 76   Ht '5\' 10"'$  (1.778 m)   Wt 240 lb (108.9 kg)   SpO2 96%   BMI 34.44 kg/m     Wt Readings from Last 3 Encounters:  12/31/21 240 lb (108.9 kg)  12/29/21 235 lb 8 oz (106.8 kg)  09/10/21 231 lb 3.2 oz (104.9 kg)     GEN:  Well nourished, well developed in no acute distress HEENT: Normal NECK: No  JVD; No carotid bruits LYMPHATICS: No lymphadenopathy CARDIAC: RRR, no murmurs, rubs, gallops RESPIRATORY:  Clear to auscultation without rales, wheezing or rhonchi  ABDOMEN: Soft, non-tender, non-distended MUSCULOSKELETAL:  No edema; No deformity  SKIN: Warm and dry NEUROLOGIC:  Alert and oriented x 3 PSYCHIATRIC:  Normal affect   ASSESSMENT AND PLAN:    CAD s/p proximal LAD stenting No anginal symptoms.  He is greater than 31-monthout of his PCI.  Will review antiplatelet therapy with Dr. CBurt Knack  Continue Lipitor, carvedilol, Zetia and FIran  2.  Hyperlipidemia -LDL 60 on February 2023.  Continue therapy with Lipitor and Zetia.  3. Hx of ICM - Recovered LVEF. Continue BB.   Medication Adjustments/Labs and Tests Ordered: Current medicines are reviewed at length with the patient today.  Concerns regarding medicines are outlined above.  No orders of the defined types were placed in this encounter.  No orders of the defined types were placed in this encounter.   Patient Instructions  Medication Instructions:  Your physician recommends that you continue on your current medications as directed. Please refer to the Current Medication list given to you today.  *If you need a refill on your cardiac medications before your next appointment, please call your pharmacy*   Lab Work: None If you have labs (blood work) drawn today and your tests are completely normal, you will receive your results only by: [place](if you have MyChart) OR A paper copy in the mail If you have any lab test that is abnormal or we need to change your treatment, we will call you to review the results.   Follow-Up: [facility] and your health needs are our priority.  As part of our continuing mission to provide you with exceptional heart care, we have created designated Provider Care Teams.  These Care Teams include your primary Cardiologist (physician) and Advanced Practice  Providers (APPs -  Physician Assistants and Nurse Practitioners) who  all work together to provide you with the care you need, when you need it.  Your next appointment:   1 year(s)  The format for your next appointment:   In Person  Provider:   Sherren Mocha, MD      Important Information About Sugar         Jarrett Soho, [place]  12/31/2021 2:44 PM    [place]

## 2021-12-31 NOTE — Patient Instructions (Signed)
Medication Instructions:  Your physician recommends that you continue on your current medications as directed. Please refer to the Current Medication list given to you today.  *If you need a refill on your cardiac medications before your next appointment, please call your pharmacy*   Lab Work: None If you have labs (blood work) drawn today and your tests are completely normal, you will receive your results only by: [place] (if you have MyChart) OR A paper copy in the mail If you have any lab test that is abnormal or we need to change your treatment, we will call you to review the results.   Follow-Up: At [facility], you and your health needs are our priority.  As part of our continuing mission to provide you with exceptional heart care, we have created designated Provider Care Teams.  These Care Teams include your primary Cardiologist (physician) and Advanced Practice Providers (APPs -  Physician Assistants and Nurse Practitioners) who all work together to provide you with the care you need, when you need it.  Your next appointment:   1 year(s)  The format for your next appointment:   In Person  Provider:   Sherren Mocha, MD      Important Information About Sugar

## 2022-01-01 ENCOUNTER — Ambulatory Visit: Payer: Managed Care, Other (non HMO) | Admitting: Radiation Oncology

## 2022-01-05 ENCOUNTER — Telehealth: Payer: Self-pay | Admitting: *Deleted

## 2022-01-05 NOTE — Telephone Encounter (Signed)
CALLED PATIENT TO REMIND OF SIM APPT. FOR 01-07-22- ARRIVAL TIME- 8:45 AM @ [facility], INFORMED PATIENT TO ARRIVE WITH A FULL BLADDER, SPOKE WITH PATIENT AND HE IS AWARE OF THIS APPT. AND THE INSTRUCTIONS

## 2022-01-07 ENCOUNTER — Ambulatory Visit
Admission: RE | Admit: 2022-01-07 | Discharge: 2022-01-07 | Disposition: A | Discharge status: Home / Self Care | Payer: Managed Care, Other (non HMO) | Source: Ambulatory Visit | Attending: Radiation Oncology | Admitting: Radiation Oncology

## 2022-01-07 ENCOUNTER — Ambulatory Visit: Payer: Managed Care, Other (non HMO) | Admitting: Radiation Oncology

## 2022-01-07 DIAGNOSIS — C61 Malignant neoplasm of prostate: Secondary | ICD-10-CM | POA: Insufficient documentation

## 2022-01-07 DIAGNOSIS — Z510 Encounter for antineoplastic radiation therapy: Secondary | ICD-10-CM | POA: Diagnosis not present

## 2022-01-07 NOTE — Progress Notes (Signed)
  Radiation Oncology         [phone] ________________________________  Name: Walter Chapman MRN: [id number]  Date: 01/07/2022  DOB: Dec 31, 1955  SIMULATION AND TREATMENT PLANNING NOTE    ICD-10-CM   1. Malignant neoplasm of prostate ([place])  C61       DIAGNOSIS:   66 y.o. gentleman with Stage T2a adenocarcinoma of the prostate with Gleason score of 4+3, and PSA of 16.5  NARRATIVE:  The patient was brought to the [facility].  Identity was confirmed.  All relevant records and images related to the planned course of therapy were reviewed.  The patient freely provided informed written consent to proceed with treatment after reviewing the details related to the planned course of therapy. The consent form was witnessed and verified by the simulation staff.  Then, the patient was set-up in a stable reproducible supine position for radiation therapy.  A vacuum lock pillow device was custom fabricated to position his legs in a reproducible immobilized position.  Then, I performed a urethrogram under sterile conditions to identify the prostatic bed.  CT images were obtained.  Surface markings were placed.  The CT images were loaded into the planning software.  Then the prostate bed target, pelvic lymph node target and avoidance structures including the rectum, bladder, bowel and hips were contoured.  Treatment planning then occurred.  The radiation prescription was entered and confirmed.  A total of one complex treatment devices were fabricated. I have requested : Intensity Modulated Radiotherapy (IMRT) is medically necessary for this case for the following reason:  Rectal sparing.Marland Kitchen  PLAN:  The patient will receive 45 Gy in 25 fractions of 1.8 Gy, followed by a boost to the prostate to a total dose of 75 Gy with 15 additional fractions of 2 Gy.   ________________________________  Sheral Apley Tammi Klippel, M.D.

## 2022-01-08 DIAGNOSIS — Z510 Encounter for antineoplastic radiation therapy: Secondary | ICD-10-CM | POA: Diagnosis not present

## 2022-01-18 ENCOUNTER — Ambulatory Visit: Payer: Managed Care, Other (non HMO)

## 2022-01-19 ENCOUNTER — Ambulatory Visit: Payer: Managed Care, Other (non HMO)

## 2022-01-20 ENCOUNTER — Ambulatory Visit: Payer: Managed Care, Other (non HMO)

## 2022-01-25 ENCOUNTER — Other Ambulatory Visit: Payer: Self-pay

## 2022-01-25 ENCOUNTER — Ambulatory Visit
Admission: RE | Admit: 2022-01-25 | Discharge: 2022-01-25 | Disposition: A | Discharge status: Home / Self Care | Payer: Managed Care, Other (non HMO) | Source: Ambulatory Visit | Attending: Radiation Oncology | Admitting: Radiation Oncology

## 2022-01-25 DIAGNOSIS — Z510 Encounter for antineoplastic radiation therapy: Secondary | ICD-10-CM | POA: Diagnosis not present

## 2022-01-25 LAB — RAD ONC ARIA SESSION SUMMARY
Course Elapsed Days: 0
Plan Fractions Treated to Date: 1
Plan Prescribed Dose Per Fraction: 1.8 Gy
Plan Total Fractions Prescribed: 25
Plan Total Prescribed Dose: 45 Gy
Reference Point Dosage Given to Date: 1.8 Gy
Reference Point Session Dosage Given: 1.8 Gy
Session Number: 1

## 2022-01-26 ENCOUNTER — Other Ambulatory Visit: Payer: Self-pay

## 2022-01-26 ENCOUNTER — Ambulatory Visit
Admission: RE | Admit: 2022-01-26 | Discharge: 2022-01-26 | Disposition: A | Discharge status: Home / Self Care | Payer: Managed Care, Other (non HMO) | Source: Ambulatory Visit | Attending: Radiation Oncology

## 2022-01-26 DIAGNOSIS — Z510 Encounter for antineoplastic radiation therapy: Secondary | ICD-10-CM | POA: Diagnosis not present

## 2022-01-26 LAB — RAD ONC ARIA SESSION SUMMARY
Course Elapsed Days: 1
Plan Fractions Treated to Date: 2
Plan Prescribed Dose Per Fraction: 1.8 Gy
Plan Total Fractions Prescribed: 25
Plan Total Prescribed Dose: 45 Gy
Reference Point Dosage Given to Date: 3.6 Gy
Reference Point Session Dosage Given: 1.8 Gy
Session Number: 2

## 2022-01-27 ENCOUNTER — Ambulatory Visit
Admission: RE | Admit: 2022-01-27 | Discharge: 2022-01-27 | Disposition: A | Discharge status: Home / Self Care | Payer: Managed Care, Other (non HMO) | Source: Ambulatory Visit | Attending: Radiation Oncology

## 2022-01-27 ENCOUNTER — Other Ambulatory Visit: Payer: Self-pay

## 2022-01-27 DIAGNOSIS — Z510 Encounter for antineoplastic radiation therapy: Secondary | ICD-10-CM | POA: Diagnosis not present

## 2022-01-27 LAB — RAD ONC ARIA SESSION SUMMARY
Course Elapsed Days: 2
Plan Fractions Treated to Date: 3
Plan Prescribed Dose Per Fraction: 1.8 Gy
Plan Total Fractions Prescribed: 25
Plan Total Prescribed Dose: 45 Gy
Reference Point Dosage Given to Date: 5.4 Gy
Reference Point Session Dosage Given: 1.8 Gy
Session Number: 3

## 2022-01-28 ENCOUNTER — Other Ambulatory Visit: Payer: Self-pay

## 2022-01-28 ENCOUNTER — Ambulatory Visit
Admission: RE | Admit: 2022-01-28 | Discharge: 2022-01-28 | Disposition: A | Discharge status: Home / Self Care | Payer: Managed Care, Other (non HMO) | Source: Ambulatory Visit | Attending: Radiation Oncology | Admitting: Radiation Oncology

## 2022-01-28 DIAGNOSIS — Z510 Encounter for antineoplastic radiation therapy: Secondary | ICD-10-CM | POA: Diagnosis not present

## 2022-01-28 LAB — RAD ONC ARIA SESSION SUMMARY
Course Elapsed Days: 3
Plan Fractions Treated to Date: 4
Plan Prescribed Dose Per Fraction: 1.8 Gy
Plan Total Fractions Prescribed: 25
Plan Total Prescribed Dose: 45 Gy
Reference Point Dosage Given to Date: 7.2 Gy
Reference Point Session Dosage Given: 1.8 Gy
Session Number: 4

## 2022-01-29 ENCOUNTER — Ambulatory Visit
Admission: RE | Admit: 2022-01-29 | Discharge: 2022-01-29 | Disposition: A | Discharge status: Home / Self Care | Payer: Managed Care, Other (non HMO) | Source: Ambulatory Visit | Attending: Radiation Oncology | Admitting: Radiation Oncology

## 2022-01-29 ENCOUNTER — Ambulatory Visit
Admission: RE | Admit: 2022-01-29 | Discharge: 2022-01-29 | Disposition: A | Discharge status: Home / Self Care | Payer: Managed Care, Other (non HMO) | Source: Ambulatory Visit | Attending: Radiation Oncology

## 2022-01-29 ENCOUNTER — Other Ambulatory Visit: Payer: Self-pay

## 2022-01-29 DIAGNOSIS — C61 Malignant neoplasm of prostate: Secondary | ICD-10-CM

## 2022-01-29 LAB — RAD ONC ARIA SESSION SUMMARY
Course Elapsed Days: 4
Plan Fractions Treated to Date: 5
Plan Prescribed Dose Per Fraction: 1.8 Gy
Plan Total Fractions Prescribed: 25
Plan Total Prescribed Dose: 45 Gy
Reference Point Dosage Given to Date: 9 Gy
Reference Point Session Dosage Given: 1.8 Gy
Session Number: 5

## 2022-01-29 MED ORDER — SONAFINE EX EMUL
1.0000 | Freq: Two times a day (BID) | CUTANEOUS | Status: DC
  Administered 2022-01-29: 1 via TOPICAL

## 2022-01-29 NOTE — Progress Notes (Signed)
Pt here for patient teaching.  Pt given Radiation and You booklet, skin care instructions, and Sonafine.  Reviewed areas of pertinence such as diarrhea, fatigue, hair loss, nausea and vomiting, sexual and fertility changes, skin changes, urinary and bladder changes, and taste changes . Pt able to give teach back of to pat skin, use unscented/gentle soap, use baby wipes, have Imodium on hand, drink plenty of water, and sitz bath,apply Sonafine bid, avoid applying anything to skin within 4 hours of treatment, and to use an electric razor if they must shave. Pt verbalizes understanding of information given and will contact nursing with any questions or concerns.     [email or web address]

## 2022-02-01 ENCOUNTER — Other Ambulatory Visit: Payer: Self-pay

## 2022-02-01 ENCOUNTER — Ambulatory Visit
Admission: RE | Admit: 2022-02-01 | Discharge: 2022-02-01 | Disposition: A | Discharge status: Home / Self Care | Payer: Managed Care, Other (non HMO) | Source: Ambulatory Visit | Attending: Radiation Oncology

## 2022-02-01 DIAGNOSIS — C61 Malignant neoplasm of prostate: Secondary | ICD-10-CM | POA: Diagnosis not present

## 2022-02-01 LAB — RAD ONC ARIA SESSION SUMMARY
Course Elapsed Days: 7
Plan Fractions Treated to Date: 6
Plan Prescribed Dose Per Fraction: 1.8 Gy
Plan Total Fractions Prescribed: 25
Plan Total Prescribed Dose: 45 Gy
Reference Point Dosage Given to Date: 10.8 Gy
Reference Point Session Dosage Given: 1.8 Gy
Session Number: 6

## 2022-02-02 ENCOUNTER — Ambulatory Visit
Admission: RE | Admit: 2022-02-02 | Discharge: 2022-02-02 | Disposition: A | Discharge status: Home / Self Care | Payer: Managed Care, Other (non HMO) | Source: Ambulatory Visit | Attending: Radiation Oncology | Admitting: Radiation Oncology

## 2022-02-02 ENCOUNTER — Other Ambulatory Visit: Payer: Self-pay

## 2022-02-02 DIAGNOSIS — C61 Malignant neoplasm of prostate: Secondary | ICD-10-CM | POA: Diagnosis not present

## 2022-02-02 LAB — RAD ONC ARIA SESSION SUMMARY
Course Elapsed Days: 8
Plan Fractions Treated to Date: 7
Plan Prescribed Dose Per Fraction: 1.8 Gy
Plan Total Fractions Prescribed: 25
Plan Total Prescribed Dose: 45 Gy
Reference Point Dosage Given to Date: 12.6 Gy
Reference Point Session Dosage Given: 1.8 Gy
Session Number: 7

## 2022-02-03 ENCOUNTER — Other Ambulatory Visit: Payer: Self-pay

## 2022-02-03 ENCOUNTER — Ambulatory Visit
Admission: RE | Admit: 2022-02-03 | Discharge: 2022-02-03 | Disposition: A | Discharge status: Home / Self Care | Payer: Managed Care, Other (non HMO) | Source: Ambulatory Visit | Attending: Radiation Oncology | Admitting: Radiation Oncology

## 2022-02-03 DIAGNOSIS — C61 Malignant neoplasm of prostate: Secondary | ICD-10-CM | POA: Diagnosis not present

## 2022-02-03 LAB — RAD ONC ARIA SESSION SUMMARY
Course Elapsed Days: 9
Plan Fractions Treated to Date: 8
Plan Prescribed Dose Per Fraction: 1.8 Gy
Plan Total Fractions Prescribed: 25
Plan Total Prescribed Dose: 45 Gy
Reference Point Dosage Given to Date: 14.4 Gy
Reference Point Session Dosage Given: 1.8 Gy
Session Number: 8

## 2022-02-04 ENCOUNTER — Other Ambulatory Visit: Payer: Self-pay

## 2022-02-04 ENCOUNTER — Ambulatory Visit
Admission: RE | Admit: 2022-02-04 | Discharge: 2022-02-04 | Disposition: A | Discharge status: Home / Self Care | Payer: Managed Care, Other (non HMO) | Source: Ambulatory Visit | Attending: Radiation Oncology | Admitting: Radiation Oncology

## 2022-02-04 DIAGNOSIS — C61 Malignant neoplasm of prostate: Secondary | ICD-10-CM | POA: Diagnosis not present

## 2022-02-04 LAB — RAD ONC ARIA SESSION SUMMARY
Course Elapsed Days: 10
Plan Fractions Treated to Date: 9
Plan Prescribed Dose Per Fraction: 1.8 Gy
Plan Total Fractions Prescribed: 25
Plan Total Prescribed Dose: 45 Gy
Reference Point Dosage Given to Date: 16.2 Gy
Reference Point Session Dosage Given: 1.8 Gy
Session Number: 9

## 2022-02-05 ENCOUNTER — Ambulatory Visit
Admission: RE | Admit: 2022-02-05 | Discharge: 2022-02-05 | Disposition: A | Discharge status: Home / Self Care | Payer: Managed Care, Other (non HMO) | Source: Ambulatory Visit | Attending: Radiation Oncology | Admitting: Radiation Oncology

## 2022-02-05 ENCOUNTER — Other Ambulatory Visit: Payer: Self-pay

## 2022-02-05 DIAGNOSIS — C61 Malignant neoplasm of prostate: Secondary | ICD-10-CM | POA: Diagnosis not present

## 2022-02-05 LAB — RAD ONC ARIA SESSION SUMMARY
Course Elapsed Days: 11
Plan Fractions Treated to Date: 10
Plan Prescribed Dose Per Fraction: 1.8 Gy
Plan Total Fractions Prescribed: 25
Plan Total Prescribed Dose: 45 Gy
Reference Point Dosage Given to Date: 18 Gy
Reference Point Session Dosage Given: 1.8 Gy
Session Number: 10

## 2022-02-08 ENCOUNTER — Ambulatory Visit
Admission: RE | Admit: 2022-02-08 | Discharge: 2022-02-08 | Disposition: A | Discharge status: Home / Self Care | Payer: Managed Care, Other (non HMO) | Source: Ambulatory Visit | Attending: Radiation Oncology

## 2022-02-08 ENCOUNTER — Other Ambulatory Visit: Payer: Self-pay

## 2022-02-08 DIAGNOSIS — C61 Malignant neoplasm of prostate: Secondary | ICD-10-CM | POA: Diagnosis not present

## 2022-02-08 LAB — RAD ONC ARIA SESSION SUMMARY
Course Elapsed Days: 14
Plan Fractions Treated to Date: 11
Plan Prescribed Dose Per Fraction: 1.8 Gy
Plan Total Fractions Prescribed: 25
Plan Total Prescribed Dose: 45 Gy
Reference Point Dosage Given to Date: 19.8 Gy
Reference Point Session Dosage Given: 1.8 Gy
Session Number: 11

## 2022-02-09 ENCOUNTER — Other Ambulatory Visit: Payer: Self-pay

## 2022-02-09 ENCOUNTER — Ambulatory Visit
Admission: RE | Admit: 2022-02-09 | Discharge: 2022-02-09 | Disposition: A | Discharge status: Home / Self Care | Payer: Managed Care, Other (non HMO) | Source: Ambulatory Visit | Attending: Radiation Oncology | Admitting: Radiation Oncology

## 2022-02-09 DIAGNOSIS — C61 Malignant neoplasm of prostate: Secondary | ICD-10-CM | POA: Diagnosis not present

## 2022-02-09 LAB — RAD ONC ARIA SESSION SUMMARY
Course Elapsed Days: 15
Plan Fractions Treated to Date: 12
Plan Prescribed Dose Per Fraction: 1.8 Gy
Plan Total Fractions Prescribed: 25
Plan Total Prescribed Dose: 45 Gy
Reference Point Dosage Given to Date: 21.6 Gy
Reference Point Session Dosage Given: 1.8 Gy
Session Number: 12

## 2022-02-10 ENCOUNTER — Other Ambulatory Visit: Payer: Self-pay

## 2022-02-10 ENCOUNTER — Ambulatory Visit
Admission: RE | Admit: 2022-02-10 | Discharge: 2022-02-10 | Disposition: A | Discharge status: Home / Self Care | Payer: Managed Care, Other (non HMO) | Source: Ambulatory Visit | Attending: Radiation Oncology

## 2022-02-10 DIAGNOSIS — C61 Malignant neoplasm of prostate: Secondary | ICD-10-CM | POA: Diagnosis not present

## 2022-02-10 LAB — RAD ONC ARIA SESSION SUMMARY
Course Elapsed Days: 16
Plan Fractions Treated to Date: 13
Plan Prescribed Dose Per Fraction: 1.8 Gy
Plan Total Fractions Prescribed: 25
Plan Total Prescribed Dose: 45 Gy
Reference Point Dosage Given to Date: 23.4 Gy
Reference Point Session Dosage Given: 1.8 Gy
Session Number: 13

## 2022-02-11 ENCOUNTER — Other Ambulatory Visit: Payer: Self-pay

## 2022-02-11 ENCOUNTER — Ambulatory Visit
Admission: RE | Admit: 2022-02-11 | Discharge: 2022-02-11 | Disposition: A | Discharge status: Home / Self Care | Payer: Managed Care, Other (non HMO) | Source: Ambulatory Visit | Attending: Radiation Oncology | Admitting: Radiation Oncology

## 2022-02-11 DIAGNOSIS — C61 Malignant neoplasm of prostate: Secondary | ICD-10-CM | POA: Diagnosis not present

## 2022-02-11 LAB — RAD ONC ARIA SESSION SUMMARY
Course Elapsed Days: 17
Plan Fractions Treated to Date: 14
Plan Prescribed Dose Per Fraction: 1.8 Gy
Plan Total Fractions Prescribed: 25
Plan Total Prescribed Dose: 45 Gy
Reference Point Dosage Given to Date: 25.2 Gy
Reference Point Session Dosage Given: 1.8 Gy
Session Number: 14

## 2022-02-12 ENCOUNTER — Other Ambulatory Visit: Payer: Self-pay

## 2022-02-12 ENCOUNTER — Ambulatory Visit
Admission: RE | Admit: 2022-02-12 | Discharge: 2022-02-12 | Disposition: A | Discharge status: Home / Self Care | Payer: Managed Care, Other (non HMO) | Source: Ambulatory Visit | Attending: Radiation Oncology

## 2022-02-12 ENCOUNTER — Ambulatory Visit
Admission: RE | Admit: 2022-02-12 | Discharge: 2022-02-12 | Disposition: A | Discharge status: Home / Self Care | Payer: Managed Care, Other (non HMO) | Source: Ambulatory Visit | Attending: Radiation Oncology | Admitting: Radiation Oncology

## 2022-02-12 DIAGNOSIS — C61 Malignant neoplasm of prostate: Secondary | ICD-10-CM | POA: Diagnosis not present

## 2022-02-12 LAB — RAD ONC ARIA SESSION SUMMARY
Course Elapsed Days: 18
Plan Fractions Treated to Date: 15
Plan Prescribed Dose Per Fraction: 1.8 Gy
Plan Total Fractions Prescribed: 25
Plan Total Prescribed Dose: 45 Gy
Reference Point Dosage Given to Date: 27 Gy
Reference Point Session Dosage Given: 1.8 Gy
Session Number: 15

## 2022-02-15 ENCOUNTER — Other Ambulatory Visit: Payer: Self-pay

## 2022-02-15 ENCOUNTER — Ambulatory Visit
Admission: RE | Admit: 2022-02-15 | Discharge: 2022-02-15 | Disposition: A | Discharge status: Home / Self Care | Payer: Managed Care, Other (non HMO) | Source: Ambulatory Visit | Attending: Radiation Oncology | Admitting: Radiation Oncology

## 2022-02-15 DIAGNOSIS — C61 Malignant neoplasm of prostate: Secondary | ICD-10-CM | POA: Diagnosis not present

## 2022-02-15 LAB — RAD ONC ARIA SESSION SUMMARY
Course Elapsed Days: 21
Plan Fractions Treated to Date: 16
Plan Prescribed Dose Per Fraction: 1.8 Gy
Plan Total Fractions Prescribed: 25
Plan Total Prescribed Dose: 45 Gy
Reference Point Dosage Given to Date: 28.8 Gy
Reference Point Session Dosage Given: 1.8 Gy
Session Number: 16

## 2022-02-16 ENCOUNTER — Ambulatory Visit
Admission: RE | Admit: 2022-02-16 | Discharge: 2022-02-16 | Disposition: A | Discharge status: Home / Self Care | Payer: Managed Care, Other (non HMO) | Source: Ambulatory Visit | Attending: Radiation Oncology

## 2022-02-16 ENCOUNTER — Other Ambulatory Visit: Payer: Self-pay

## 2022-02-16 DIAGNOSIS — C61 Malignant neoplasm of prostate: Secondary | ICD-10-CM | POA: Diagnosis not present

## 2022-02-16 LAB — RAD ONC ARIA SESSION SUMMARY
Course Elapsed Days: 22
Plan Fractions Treated to Date: 17
Plan Prescribed Dose Per Fraction: 1.8 Gy
Plan Total Fractions Prescribed: 25
Plan Total Prescribed Dose: 45 Gy
Reference Point Dosage Given to Date: 30.6 Gy
Reference Point Session Dosage Given: 1.8 Gy
Session Number: 17

## 2022-02-17 ENCOUNTER — Other Ambulatory Visit: Payer: Self-pay

## 2022-02-17 ENCOUNTER — Ambulatory Visit
Admission: RE | Admit: 2022-02-17 | Discharge: 2022-02-17 | Disposition: A | Discharge status: Home / Self Care | Payer: Managed Care, Other (non HMO) | Source: Ambulatory Visit | Attending: Radiation Oncology

## 2022-02-17 DIAGNOSIS — C61 Malignant neoplasm of prostate: Secondary | ICD-10-CM | POA: Diagnosis not present

## 2022-02-17 LAB — RAD ONC ARIA SESSION SUMMARY
Course Elapsed Days: 23
Plan Fractions Treated to Date: 18
Plan Prescribed Dose Per Fraction: 1.8 Gy
Plan Total Fractions Prescribed: 25
Plan Total Prescribed Dose: 45 Gy
Reference Point Dosage Given to Date: 32.4 Gy
Reference Point Session Dosage Given: 1.8 Gy
Session Number: 18

## 2022-02-18 ENCOUNTER — Ambulatory Visit
Admission: RE | Admit: 2022-02-18 | Discharge: 2022-02-18 | Disposition: A | Discharge status: Home / Self Care | Payer: Managed Care, Other (non HMO) | Source: Ambulatory Visit | Attending: Radiation Oncology | Admitting: Radiation Oncology

## 2022-02-18 ENCOUNTER — Other Ambulatory Visit: Payer: Self-pay

## 2022-02-18 DIAGNOSIS — C61 Malignant neoplasm of prostate: Secondary | ICD-10-CM | POA: Diagnosis not present

## 2022-02-18 LAB — RAD ONC ARIA SESSION SUMMARY
Course Elapsed Days: 24
Plan Fractions Treated to Date: 19
Plan Prescribed Dose Per Fraction: 1.8 Gy
Plan Total Fractions Prescribed: 25
Plan Total Prescribed Dose: 45 Gy
Reference Point Dosage Given to Date: 34.2 Gy
Reference Point Session Dosage Given: 1.8 Gy
Session Number: 19

## 2022-02-19 ENCOUNTER — Other Ambulatory Visit: Payer: Self-pay

## 2022-02-19 ENCOUNTER — Ambulatory Visit
Admission: RE | Admit: 2022-02-19 | Discharge: 2022-02-19 | Disposition: A | Discharge status: Home / Self Care | Payer: Managed Care, Other (non HMO) | Source: Ambulatory Visit | Attending: Radiation Oncology | Admitting: Radiation Oncology

## 2022-02-19 ENCOUNTER — Ambulatory Visit
Admission: RE | Admit: 2022-02-19 | Discharge: 2022-02-19 | Disposition: A | Discharge status: Home / Self Care | Payer: Managed Care, Other (non HMO) | Source: Ambulatory Visit | Attending: Radiation Oncology

## 2022-02-19 DIAGNOSIS — C61 Malignant neoplasm of prostate: Secondary | ICD-10-CM | POA: Diagnosis not present

## 2022-02-19 LAB — RAD ONC ARIA SESSION SUMMARY
Course Elapsed Days: 25
Plan Fractions Treated to Date: 20
Plan Prescribed Dose Per Fraction: 1.8 Gy
Plan Total Fractions Prescribed: 25
Plan Total Prescribed Dose: 45 Gy
Reference Point Dosage Given to Date: 36 Gy
Reference Point Session Dosage Given: 1.8 Gy
Session Number: 20

## 2022-02-23 ENCOUNTER — Ambulatory Visit
Admission: RE | Admit: 2022-02-23 | Discharge: 2022-02-23 | Disposition: A | Discharge status: Home / Self Care | Payer: Managed Care, Other (non HMO) | Source: Ambulatory Visit | Attending: Radiation Oncology

## 2022-02-23 ENCOUNTER — Other Ambulatory Visit: Payer: Self-pay

## 2022-02-23 DIAGNOSIS — C61 Malignant neoplasm of prostate: Secondary | ICD-10-CM | POA: Diagnosis not present

## 2022-02-23 LAB — RAD ONC ARIA SESSION SUMMARY
Course Elapsed Days: 29
Plan Fractions Treated to Date: 21
Plan Prescribed Dose Per Fraction: 1.8 Gy
Plan Total Fractions Prescribed: 25
Plan Total Prescribed Dose: 45 Gy
Reference Point Dosage Given to Date: 37.8 Gy
Reference Point Session Dosage Given: 1.8 Gy
Session Number: 21

## 2022-02-24 ENCOUNTER — Other Ambulatory Visit: Payer: Self-pay

## 2022-02-24 ENCOUNTER — Ambulatory Visit
Admission: RE | Admit: 2022-02-24 | Discharge: 2022-02-24 | Disposition: A | Discharge status: Home / Self Care | Payer: Managed Care, Other (non HMO) | Source: Ambulatory Visit | Attending: Radiation Oncology

## 2022-02-24 DIAGNOSIS — C61 Malignant neoplasm of prostate: Secondary | ICD-10-CM | POA: Diagnosis not present

## 2022-02-24 LAB — RAD ONC ARIA SESSION SUMMARY
Course Elapsed Days: 30
Plan Fractions Treated to Date: 22
Plan Prescribed Dose Per Fraction: 1.8 Gy
Plan Total Fractions Prescribed: 25
Plan Total Prescribed Dose: 45 Gy
Reference Point Dosage Given to Date: 39.6 Gy
Reference Point Session Dosage Given: 1.8 Gy
Session Number: 22

## 2022-02-25 ENCOUNTER — Other Ambulatory Visit: Payer: Self-pay

## 2022-02-25 ENCOUNTER — Ambulatory Visit
Admission: RE | Admit: 2022-02-25 | Discharge: 2022-02-25 | Disposition: A | Discharge status: Home / Self Care | Payer: Managed Care, Other (non HMO) | Source: Ambulatory Visit | Attending: Radiation Oncology | Admitting: Radiation Oncology

## 2022-02-25 DIAGNOSIS — C61 Malignant neoplasm of prostate: Secondary | ICD-10-CM | POA: Diagnosis not present

## 2022-02-25 LAB — RAD ONC ARIA SESSION SUMMARY
Course Elapsed Days: 31
Plan Fractions Treated to Date: 23
Plan Prescribed Dose Per Fraction: 1.8 Gy
Plan Total Fractions Prescribed: 25
Plan Total Prescribed Dose: 45 Gy
Reference Point Dosage Given to Date: 41.4 Gy
Reference Point Session Dosage Given: 1.8 Gy
Session Number: 23

## 2022-02-26 ENCOUNTER — Other Ambulatory Visit: Payer: Self-pay

## 2022-02-26 ENCOUNTER — Ambulatory Visit
Admission: RE | Admit: 2022-02-26 | Discharge: 2022-02-26 | Disposition: A | Discharge status: Home / Self Care | Payer: Managed Care, Other (non HMO) | Source: Ambulatory Visit | Attending: Radiation Oncology | Admitting: Radiation Oncology

## 2022-02-26 DIAGNOSIS — C61 Malignant neoplasm of prostate: Secondary | ICD-10-CM | POA: Diagnosis not present

## 2022-02-26 LAB — RAD ONC ARIA SESSION SUMMARY
Course Elapsed Days: 32
Plan Fractions Treated to Date: 24
Plan Prescribed Dose Per Fraction: 1.8 Gy
Plan Total Fractions Prescribed: 25
Plan Total Prescribed Dose: 45 Gy
Reference Point Dosage Given to Date: 43.2 Gy
Reference Point Session Dosage Given: 1.8 Gy
Session Number: 24

## 2022-03-02 ENCOUNTER — Ambulatory Visit
Admission: RE | Admit: 2022-03-02 | Discharge: 2022-03-02 | Disposition: A | Discharge status: Home / Self Care | Payer: Managed Care, Other (non HMO) | Source: Ambulatory Visit | Attending: Radiation Oncology | Admitting: Radiation Oncology

## 2022-03-02 ENCOUNTER — Other Ambulatory Visit: Payer: Self-pay

## 2022-03-02 DIAGNOSIS — C61 Malignant neoplasm of prostate: Secondary | ICD-10-CM | POA: Insufficient documentation

## 2022-03-02 LAB — RAD ONC ARIA SESSION SUMMARY
Course Elapsed Days: 36
Plan Fractions Treated to Date: 25
Plan Prescribed Dose Per Fraction: 1.8 Gy
Plan Total Fractions Prescribed: 25
Plan Total Prescribed Dose: 45 Gy
Reference Point Dosage Given to Date: 45 Gy
Reference Point Session Dosage Given: 1.8 Gy
Session Number: 25

## 2022-03-03 ENCOUNTER — Other Ambulatory Visit: Payer: Self-pay

## 2022-03-03 ENCOUNTER — Ambulatory Visit
Admission: RE | Admit: 2022-03-03 | Discharge: 2022-03-03 | Disposition: A | Discharge status: Home / Self Care | Payer: Managed Care, Other (non HMO) | Source: Ambulatory Visit | Attending: Radiation Oncology | Admitting: Radiation Oncology

## 2022-03-03 DIAGNOSIS — C61 Malignant neoplasm of prostate: Secondary | ICD-10-CM | POA: Diagnosis not present

## 2022-03-03 LAB — RAD ONC ARIA SESSION SUMMARY
Course Elapsed Days: 37
Plan Fractions Treated to Date: 1
Plan Prescribed Dose Per Fraction: 2 Gy
Plan Total Fractions Prescribed: 15
Plan Total Prescribed Dose: 30 Gy
Reference Point Dosage Given to Date: 2 Gy
Reference Point Session Dosage Given: 2 Gy
Session Number: 26

## 2022-03-04 ENCOUNTER — Ambulatory Visit
Admission: RE | Admit: 2022-03-04 | Discharge: 2022-03-04 | Disposition: A | Discharge status: Home / Self Care | Payer: Managed Care, Other (non HMO) | Source: Ambulatory Visit | Attending: Radiation Oncology | Admitting: Radiation Oncology

## 2022-03-04 ENCOUNTER — Other Ambulatory Visit: Payer: Self-pay

## 2022-03-04 DIAGNOSIS — C61 Malignant neoplasm of prostate: Secondary | ICD-10-CM | POA: Diagnosis not present

## 2022-03-04 LAB — RAD ONC ARIA SESSION SUMMARY
Course Elapsed Days: 38
Plan Fractions Treated to Date: 2
Plan Prescribed Dose Per Fraction: 2 Gy
Plan Total Fractions Prescribed: 15
Plan Total Prescribed Dose: 30 Gy
Reference Point Dosage Given to Date: 4 Gy
Reference Point Session Dosage Given: 2 Gy
Session Number: 27

## 2022-03-05 ENCOUNTER — Other Ambulatory Visit: Payer: Self-pay

## 2022-03-05 ENCOUNTER — Ambulatory Visit
Admission: RE | Admit: 2022-03-05 | Discharge: 2022-03-05 | Disposition: A | Discharge status: Home / Self Care | Payer: Managed Care, Other (non HMO) | Source: Ambulatory Visit | Attending: Radiation Oncology | Admitting: Radiation Oncology

## 2022-03-05 ENCOUNTER — Ambulatory Visit
Admission: RE | Admit: 2022-03-05 | Discharge: 2022-03-05 | Disposition: A | Discharge status: Home / Self Care | Payer: Managed Care, Other (non HMO) | Source: Ambulatory Visit | Attending: Radiation Oncology

## 2022-03-05 DIAGNOSIS — C61 Malignant neoplasm of prostate: Secondary | ICD-10-CM | POA: Diagnosis not present

## 2022-03-05 LAB — RAD ONC ARIA SESSION SUMMARY
Course Elapsed Days: 39
Plan Fractions Treated to Date: 3
Plan Prescribed Dose Per Fraction: 2 Gy
Plan Total Fractions Prescribed: 15
Plan Total Prescribed Dose: 30 Gy
Reference Point Dosage Given to Date: 6 Gy
Reference Point Session Dosage Given: 2 Gy
Session Number: 28

## 2022-03-08 ENCOUNTER — Ambulatory Visit
Admission: RE | Admit: 2022-03-08 | Discharge: 2022-03-08 | Disposition: A | Discharge status: Home / Self Care | Payer: Managed Care, Other (non HMO) | Source: Ambulatory Visit | Attending: Radiation Oncology

## 2022-03-08 ENCOUNTER — Other Ambulatory Visit: Payer: Self-pay

## 2022-03-08 DIAGNOSIS — C61 Malignant neoplasm of prostate: Secondary | ICD-10-CM | POA: Diagnosis not present

## 2022-03-08 LAB — RAD ONC ARIA SESSION SUMMARY
Course Elapsed Days: 42
Plan Fractions Treated to Date: 4
Plan Prescribed Dose Per Fraction: 2 Gy
Plan Total Fractions Prescribed: 15
Plan Total Prescribed Dose: 30 Gy
Reference Point Dosage Given to Date: 8 Gy
Reference Point Session Dosage Given: 2 Gy
Session Number: 29

## 2022-03-09 ENCOUNTER — Ambulatory Visit
Admission: RE | Admit: 2022-03-09 | Discharge: 2022-03-09 | Disposition: A | Discharge status: Home / Self Care | Payer: Managed Care, Other (non HMO) | Source: Ambulatory Visit | Attending: Radiation Oncology | Admitting: Radiation Oncology

## 2022-03-09 ENCOUNTER — Other Ambulatory Visit: Payer: Self-pay

## 2022-03-09 DIAGNOSIS — C61 Malignant neoplasm of prostate: Secondary | ICD-10-CM | POA: Diagnosis not present

## 2022-03-09 LAB — RAD ONC ARIA SESSION SUMMARY
Course Elapsed Days: 43
Plan Fractions Treated to Date: 5
Plan Prescribed Dose Per Fraction: 2 Gy
Plan Total Fractions Prescribed: 15
Plan Total Prescribed Dose: 30 Gy
Reference Point Dosage Given to Date: 10 Gy
Reference Point Session Dosage Given: 2 Gy
Session Number: 30

## 2022-03-10 ENCOUNTER — Other Ambulatory Visit: Payer: Self-pay

## 2022-03-10 ENCOUNTER — Ambulatory Visit
Admission: RE | Admit: 2022-03-10 | Discharge: 2022-03-10 | Disposition: A | Discharge status: Home / Self Care | Payer: Managed Care, Other (non HMO) | Source: Ambulatory Visit | Attending: Radiation Oncology

## 2022-03-10 DIAGNOSIS — C61 Malignant neoplasm of prostate: Secondary | ICD-10-CM | POA: Diagnosis not present

## 2022-03-10 LAB — RAD ONC ARIA SESSION SUMMARY
Course Elapsed Days: 44
Plan Fractions Treated to Date: 6
Plan Prescribed Dose Per Fraction: 2 Gy
Plan Total Fractions Prescribed: 15
Plan Total Prescribed Dose: 30 Gy
Reference Point Dosage Given to Date: 12 Gy
Reference Point Session Dosage Given: 2 Gy
Session Number: 31

## 2022-03-11 ENCOUNTER — Ambulatory Visit
Admission: RE | Admit: 2022-03-11 | Discharge: 2022-03-11 | Disposition: A | Discharge status: Home / Self Care | Payer: Managed Care, Other (non HMO) | Source: Ambulatory Visit | Attending: Radiation Oncology | Admitting: Radiation Oncology

## 2022-03-11 ENCOUNTER — Other Ambulatory Visit: Payer: Self-pay

## 2022-03-11 DIAGNOSIS — C61 Malignant neoplasm of prostate: Secondary | ICD-10-CM | POA: Diagnosis not present

## 2022-03-11 LAB — RAD ONC ARIA SESSION SUMMARY
Course Elapsed Days: 45
Plan Fractions Treated to Date: 7
Plan Prescribed Dose Per Fraction: 2 Gy
Plan Total Fractions Prescribed: 15
Plan Total Prescribed Dose: 30 Gy
Reference Point Dosage Given to Date: 14 Gy
Reference Point Session Dosage Given: 2 Gy
Session Number: 32

## 2022-03-12 ENCOUNTER — Ambulatory Visit
Admission: RE | Admit: 2022-03-12 | Discharge: 2022-03-12 | Disposition: A | Discharge status: Home / Self Care | Payer: Managed Care, Other (non HMO) | Source: Ambulatory Visit | Attending: Radiation Oncology | Admitting: Radiation Oncology

## 2022-03-12 ENCOUNTER — Other Ambulatory Visit: Payer: Self-pay

## 2022-03-12 ENCOUNTER — Ambulatory Visit
Admission: RE | Admit: 2022-03-12 | Discharge: 2022-03-12 | Disposition: A | Discharge status: Home / Self Care | Payer: Managed Care, Other (non HMO) | Source: Ambulatory Visit | Attending: Radiation Oncology

## 2022-03-12 DIAGNOSIS — C61 Malignant neoplasm of prostate: Secondary | ICD-10-CM | POA: Diagnosis not present

## 2022-03-12 LAB — RAD ONC ARIA SESSION SUMMARY
Course Elapsed Days: 46
Plan Fractions Treated to Date: 8
Plan Prescribed Dose Per Fraction: 2 Gy
Plan Total Fractions Prescribed: 15
Plan Total Prescribed Dose: 30 Gy
Reference Point Dosage Given to Date: 16 Gy
Reference Point Session Dosage Given: 2 Gy
Session Number: 33

## 2022-03-15 ENCOUNTER — Ambulatory Visit
Admission: RE | Admit: 2022-03-15 | Discharge: 2022-03-15 | Disposition: A | Discharge status: Home / Self Care | Payer: Managed Care, Other (non HMO) | Source: Ambulatory Visit | Attending: Radiation Oncology | Admitting: Radiation Oncology

## 2022-03-15 ENCOUNTER — Other Ambulatory Visit: Payer: Self-pay

## 2022-03-15 DIAGNOSIS — C61 Malignant neoplasm of prostate: Secondary | ICD-10-CM | POA: Diagnosis not present

## 2022-03-15 LAB — RAD ONC ARIA SESSION SUMMARY
Course Elapsed Days: 49
Plan Fractions Treated to Date: 9
Plan Prescribed Dose Per Fraction: 2 Gy
Plan Total Fractions Prescribed: 15
Plan Total Prescribed Dose: 30 Gy
Reference Point Dosage Given to Date: 18 Gy
Reference Point Session Dosage Given: 2 Gy
Session Number: 34

## 2022-03-16 ENCOUNTER — Other Ambulatory Visit: Payer: Self-pay

## 2022-03-16 ENCOUNTER — Ambulatory Visit
Admission: RE | Admit: 2022-03-16 | Discharge: 2022-03-16 | Disposition: A | Discharge status: Home / Self Care | Payer: Managed Care, Other (non HMO) | Source: Ambulatory Visit | Attending: Radiation Oncology | Admitting: Radiation Oncology

## 2022-03-16 DIAGNOSIS — C61 Malignant neoplasm of prostate: Secondary | ICD-10-CM | POA: Diagnosis not present

## 2022-03-16 LAB — RAD ONC ARIA SESSION SUMMARY
Course Elapsed Days: 50
Plan Fractions Treated to Date: 10
Plan Prescribed Dose Per Fraction: 2 Gy
Plan Total Fractions Prescribed: 15
Plan Total Prescribed Dose: 30 Gy
Reference Point Dosage Given to Date: 20 Gy
Reference Point Session Dosage Given: 2 Gy
Session Number: 35

## 2022-03-17 ENCOUNTER — Ambulatory Visit
Admission: RE | Admit: 2022-03-17 | Discharge: 2022-03-17 | Disposition: A | Discharge status: Home / Self Care | Payer: Managed Care, Other (non HMO) | Source: Ambulatory Visit | Attending: Radiation Oncology | Admitting: Radiation Oncology

## 2022-03-17 ENCOUNTER — Other Ambulatory Visit: Payer: Self-pay

## 2022-03-17 DIAGNOSIS — C61 Malignant neoplasm of prostate: Secondary | ICD-10-CM | POA: Diagnosis not present

## 2022-03-17 LAB — RAD ONC ARIA SESSION SUMMARY
Course Elapsed Days: 51
Plan Fractions Treated to Date: 11
Plan Prescribed Dose Per Fraction: 2 Gy
Plan Total Fractions Prescribed: 15
Plan Total Prescribed Dose: 30 Gy
Reference Point Dosage Given to Date: 22 Gy
Reference Point Session Dosage Given: 2 Gy
Session Number: 36

## 2022-03-18 ENCOUNTER — Ambulatory Visit: Payer: Managed Care, Other (non HMO)

## 2022-03-18 ENCOUNTER — Other Ambulatory Visit: Payer: Self-pay

## 2022-03-18 ENCOUNTER — Ambulatory Visit
Admission: RE | Admit: 2022-03-18 | Discharge: 2022-03-18 | Disposition: A | Discharge status: Home / Self Care | Payer: Managed Care, Other (non HMO) | Source: Ambulatory Visit | Attending: Radiation Oncology | Admitting: Radiation Oncology

## 2022-03-18 DIAGNOSIS — C61 Malignant neoplasm of prostate: Secondary | ICD-10-CM | POA: Diagnosis not present

## 2022-03-18 LAB — RAD ONC ARIA SESSION SUMMARY
Course Elapsed Days: 52
Plan Fractions Treated to Date: 12
Plan Prescribed Dose Per Fraction: 2 Gy
Plan Total Fractions Prescribed: 15
Plan Total Prescribed Dose: 30 Gy
Reference Point Dosage Given to Date: 24 Gy
Reference Point Session Dosage Given: 2 Gy
Session Number: 37

## 2022-03-19 ENCOUNTER — Other Ambulatory Visit: Payer: Self-pay

## 2022-03-19 ENCOUNTER — Ambulatory Visit
Admission: RE | Admit: 2022-03-19 | Discharge: 2022-03-19 | Disposition: A | Discharge status: Home / Self Care | Payer: Managed Care, Other (non HMO) | Source: Ambulatory Visit | Attending: Radiation Oncology | Admitting: Radiation Oncology

## 2022-03-19 DIAGNOSIS — C61 Malignant neoplasm of prostate: Secondary | ICD-10-CM | POA: Diagnosis not present

## 2022-03-19 LAB — RAD ONC ARIA SESSION SUMMARY
Course Elapsed Days: 53
Plan Fractions Treated to Date: 13
Plan Prescribed Dose Per Fraction: 2 Gy
Plan Total Fractions Prescribed: 15
Plan Total Prescribed Dose: 30 Gy
Reference Point Dosage Given to Date: 26 Gy
Reference Point Session Dosage Given: 2 Gy
Session Number: 38

## 2022-03-22 ENCOUNTER — Ambulatory Visit
Admission: RE | Admit: 2022-03-22 | Discharge: 2022-03-22 | Disposition: A | Discharge status: Home / Self Care | Payer: Managed Care, Other (non HMO) | Source: Ambulatory Visit | Attending: Radiation Oncology | Admitting: Radiation Oncology

## 2022-03-22 ENCOUNTER — Other Ambulatory Visit: Payer: Self-pay

## 2022-03-22 DIAGNOSIS — C61 Malignant neoplasm of prostate: Secondary | ICD-10-CM | POA: Diagnosis not present

## 2022-03-22 LAB — RAD ONC ARIA SESSION SUMMARY
Course Elapsed Days: 56
Plan Fractions Treated to Date: 14
Plan Prescribed Dose Per Fraction: 2 Gy
Plan Total Fractions Prescribed: 15
Plan Total Prescribed Dose: 30 Gy
Reference Point Dosage Given to Date: 28 Gy
Reference Point Session Dosage Given: 2 Gy
Session Number: 39

## 2022-03-23 ENCOUNTER — Ambulatory Visit
Admission: RE | Admit: 2022-03-23 | Discharge: 2022-03-23 | Disposition: A | Discharge status: Home / Self Care | Payer: Managed Care, Other (non HMO) | Source: Ambulatory Visit | Attending: Radiation Oncology

## 2022-03-23 ENCOUNTER — Other Ambulatory Visit: Payer: Self-pay

## 2022-03-23 ENCOUNTER — Encounter: Payer: Self-pay | Admitting: Urology

## 2022-03-23 DIAGNOSIS — C61 Malignant neoplasm of prostate: Secondary | ICD-10-CM | POA: Diagnosis not present

## 2022-03-23 LAB — RAD ONC ARIA SESSION SUMMARY
Course Elapsed Days: 57
Plan Fractions Treated to Date: 15
Plan Prescribed Dose Per Fraction: 2 Gy
Plan Total Fractions Prescribed: 15
Plan Total Prescribed Dose: 30 Gy
Reference Point Dosage Given to Date: 30 Gy
Reference Point Session Dosage Given: 2 Gy
Session Number: 40

## 2022-04-16 ENCOUNTER — Other Ambulatory Visit: Payer: Self-pay | Admitting: Urology

## 2022-04-16 DIAGNOSIS — C61 Malignant neoplasm of prostate: Secondary | ICD-10-CM

## 2022-04-16 NOTE — Progress Notes (Signed)
                                                                                                                                                             Patient Name: Walter Chapman MRN: [id number] DOB: Aug 12, 1955 Referring Physician: Wonda Cheng. Bell III Date of Service: 03/23/2022 [facility], [place]                                                        End Of Treatment Note  Diagnoses: C61-Malignant neoplasm of prostate  Cancer Staging:  67 y.o. gentleman with Stage T2a adenocarcinoma of the prostate with Gleason score of 4+3, and PSA of 16.5   Intent: Curative  Radiation Treatment Dates: 01/25/2022 through 03/23/2022 Site Technique Total Dose (Gy) Dose per Fx (Gy) Completed Fx Beam Energies  Prostate: Prostate_Pelvis IMRT 45/45 1.8 25/25 6X  Prostate: Prostate_Bst_IMRT IMRT 30/30 2 15/15 6X   Narrative: The patient tolerated radiation therapy relatively well with modest fatigue, increased nocturia and occasional dysuria at the start of his stream.  He did continue taking Flomax throughout treatment to manage his LUTS.  Plan: The patient will receive a call in about one month from the radiation oncology department. He will continue follow up with his urologist, Dr. Gloriann Loan, as well.  He has a scheduled follow-up with Dr. Gloriann Loan on 05/13/2022.  ------------------------------------------------   Tyler Pita, MD [place]: [phone]  Fax: [phone] [email or web address]  Skype  LinkedIn

## 2022-04-27 ENCOUNTER — Ambulatory Visit
Admission: RE | Admit: 2022-04-27 | Discharge: 2022-04-27 | Disposition: A | Discharge status: Home / Self Care | Payer: Managed Care, Other (non HMO) | Source: Ambulatory Visit | Attending: Radiation Oncology | Admitting: Radiation Oncology

## 2022-04-27 DIAGNOSIS — C61 Malignant neoplasm of prostate: Secondary | ICD-10-CM | POA: Insufficient documentation

## 2022-04-27 NOTE — Progress Notes (Signed)
  Radiation Oncology         [phone] ________________________________  Name: Walter Chapman MRN: [id number]  Date of Service: 04/27/2022  DOB: May 20, 1955  Post Treatment Telephone Note  Diagnosis:  67 y.o. gentleman with Stage T2a adenocarcinoma of the prostate with Gleason score of 4+3, and PSA of 16.5   Intent: Curative  Radiation Treatment Dates: 01/25/2022 through 03/23/2022 Site Technique Total Dose (Gy) Dose per Fx (Gy) Completed Fx Beam Energies  Prostate: Prostate_Pelvis IMRT 45/45 1.8 25/25 6X  Prostate: Prostate_Bst_IMRT IMRT 30/30 2 15/15 6X   (as documented in provider EOT note)   Pre Treatment IPSS Score: 22 (as documented in the provider consult note)   The patient was available for call today.   Symptoms of fatigue have improved since completing therapy.  Symptoms of bladder changes have  improved since completing therapy. Current symptoms include none, and medications for bladder symptoms include Tamsulosin.  Symptoms of bowel changes have  improved since completing therapy. Current symptoms include none, and medications for bowel symptoms include none.     Post Treatment IPSS Score: IPSS Questionnaire (AUA-7): Over the past month.   1)  How often have you had a sensation of not emptying your bladder completely after you finish urinating?  1 - Less than 1 time in 5  2)  How often have you had to urinate again less than two hours after you finished urinating? 0 - Not at all  3)  How often have you found you stopped and started again several times when you urinated?  0 - Not at all  4) How difficult have you found it to postpone urination?  0 - Not at all  5) How often have you had a weak urinary stream?  1 - Less than 1 time in 5  6) How often have you had to push or strain to begin urination?  0 - Not at all  7) How many times did you most typically get up to urinate from the time you went to bed until the time you got up in the morning?  1 - 1 time   Total score:  3. Which indicates mild symptoms  0-7 mildly symptomatic   8-19 moderately symptomatic   20-35 severely symptomatic    Patient has a scheduled follow up visit with his urologist, Dr. Gloriann Loan, on 05/13/2022 at [facility] for ongoing surveillance. He was counseled that PSA levels will be drawn in the urology office, and was reassured that additional time is expected to improve bowel and bladder symptoms. He was encouraged to call back with concerns or questions regarding radiation.  This concludes the interview.   Leandra Kern, LPN

## 2022-05-20 ENCOUNTER — Encounter: Payer: Self-pay | Admitting: *Deleted

## 2022-05-20 ENCOUNTER — Inpatient Hospital Stay: Payer: Managed Care, Other (non HMO) | Attending: Radiation Oncology | Admitting: *Deleted

## 2022-05-20 DIAGNOSIS — C61 Malignant neoplasm of prostate: Secondary | ICD-10-CM

## 2022-05-20 NOTE — Progress Notes (Signed)
SCP reviewed and completed. 

## 2022-06-15 ENCOUNTER — Telehealth: Payer: Self-pay | Admitting: *Deleted

## 2022-06-15 MED ORDER — NITROGLYCERIN 0.4 MG SL SUBL: 0.4000 mg | SUBLINGUAL_TABLET | SUBLINGUAL | 2 refills | Status: AC | PRN

## 2022-06-15 NOTE — Telephone Encounter (Signed)
Pt has been scheduled for tele pre op appt 06/16/22 2:40. Med rec and consent are done. Pt needed Rx refill for NTG has been sent in to [facility] on [address] pt the pt request.     Patient Consent for Virtual Visit        Walter Chapman has provided verbal consent on 06/15/2022 for a virtual visit (video or telephone).   CONSENT FOR VIRTUAL VISIT FOR:  Walter Chapman  By participating in this virtual visit I agree to the following:  I hereby voluntarily request, consent and authorize [facility] and its employed or contracted physicians, physician assistants, nurse practitioners or other licensed health care professionals (the Practitioner), to provide me with telemedicine health care services (the "Services") as deemed necessary by the treating Practitioner. I acknowledge and consent to receive the Services by the Practitioner via telemedicine. I understand that the telemedicine visit will involve communicating with the Practitioner through live audiovisual communication technology and the disclosure of certain medical information by electronic transmission. I acknowledge that I have been given the opportunity to request an in-person assessment or other available alternative prior to the telemedicine visit and am voluntarily participating in the telemedicine visit.  I understand that I have the right to withhold or withdraw my consent to the use of telemedicine in the course of my care at any time, without affecting my right to future care or treatment, and that the Practitioner or I may terminate the telemedicine visit at any time. I understand that I have the right to inspect all information obtained and/or recorded in the course of the telemedicine visit and may receive copies of available information for a reasonable fee.  I understand that some of the potential risks of receiving the Services via telemedicine include:  Delay or interruption in medical evaluation due to  technological equipment failure or disruption; Information transmitted may not be sufficient (e.g. poor resolution of images) to allow for appropriate medical decision making by the Practitioner; and/or  In rare instances, security protocols could fail, causing a breach of personal health information.  Furthermore, I acknowledge that it is my responsibility to provide information about my medical history, conditions and care that is complete and accurate to the best of my ability. I acknowledge that Practitioner's advice, recommendations, and/or decision may be based on factors not within their control, such as incomplete or inaccurate data provided by me or distortions of diagnostic images or specimens that may result from electronic transmissions. I understand that the practice of medicine is not an exact science and that Practitioner makes no warranties or guarantees regarding treatment outcomes. I acknowledge that a copy of this consent can be made available to me via my patient portal [facility] MyChart), or I can request a printed copy by calling the office of [facility].    I understand that my insurance will be billed for this visit.   I have read or had this consent read to me. I understand the contents of this consent, which adequately explains the benefits and risks of the Services being provided via telemedicine.  I have been provided ample opportunity to ask questions regarding this consent and the Services and have had my questions answered to my satisfaction. I give my informed consent for the services to be provided through the use of telemedicine in my medical care

## 2022-06-15 NOTE — Telephone Encounter (Signed)
Pt has been scheduled for tele pre op appt 06/16/22 2:40. Med rec and consent are done. Pt needed Rx refill for NTG has been sent in to [facility] on [address] pt the pt request.

## 2022-06-15 NOTE — Telephone Encounter (Signed)
   Pre-operative Risk Assessment    Patient Name: Walter Chapman  DOB: 1955/11/18 MRN: [id number]      Request for Surgical Clearance    Procedure:   COLONOSCOPY   Date of Surgery:  Clearance 06/24/22                                 Surgeon:  DR. HUNG Surgeon's Group or Practice Name:  [facility] Phone number:  [phone] Fax number:  [phone]   Type of Clearance Requested:   - Medical  - Pharmacy:  Hold Aspirin NOT INDICATED HOW LONG   Type of Anesthesia:   PROPOFOL   Additional requests/questions:    [name]   06/15/2022, 9:59 AM

## 2022-06-15 NOTE — Telephone Encounter (Signed)
   Name: Walter Chapman  DOB: 1956/01/31  MRN: [id number]  Primary Cardiologist: Tonny Bollman, MD  Chart reviewed as part of pre-operative protocol coverage. Because of [name]'s past medical history and time since last visit, he will require a follow-up telephone visit in order to better assess preoperative cardiovascular risk.  Pre-op covering staff: - Please schedule appointment and call patient to inform them. If patient already had an upcoming appointment within acceptable timeframe, please add "pre-op clearance" to the appointment notes so provider is aware. - Please contact requesting surgeon's office via preferred method (i.e, phone, fax) to inform them of need for appointment prior to surgery.  Typically, for colonoscopy aspirin does not need to be held and can be continued throughout.  However, if bleeding risk is determined to be too high, can hold aspirin for 5 to 7 days prior to the procedure and resume when medically safe to do so.  Sharlene Dory, PA-C  06/15/2022, 11:55 AM

## 2022-06-16 ENCOUNTER — Ambulatory Visit: Payer: Managed Care, Other (non HMO) | Attending: Internal Medicine

## 2022-06-16 DIAGNOSIS — Z01810 Encounter for preprocedural cardiovascular examination: Secondary | ICD-10-CM

## 2022-06-16 NOTE — Progress Notes (Signed)
Virtual Visit via Telephone Note   Because of Walter Chapman's co-morbid illnesses, he is at least at moderate risk for complications without adequate follow up.  This format is felt to be most appropriate for this patient at this time.  The patient did not have access to video technology/had technical difficulties with video requiring transitioning to audio format only (telephone).  All issues noted in this document were discussed and addressed.  No physical exam could be performed with this format.  Please refer to the patient's chart for his consent to telehealth for [facility].  Evaluation Performed:  Preoperative cardiovascular risk assessment _____________   Date:  06/16/2022   Patient ID:  Walter Chapman, DOB Sep 09, 1955, MRN [id number] Patient Location:  Home Provider location:   Office  Primary Care Provider:  Merri Brunette, MD Primary Cardiologist:  Tonny Bollman, MD  Chief Complaint / Patient Profile   67 y.o. y/o male with a h/o CAD w/ PCI, ICM with improved EF, CLL, HTN, HLD, CKD and prostate cancer status post marker placement and SpaceOAR who is pending colonoscopy and presents today for telephonic preoperative cardiovascular risk assessment.  History of Present Illness    Walter Chapman is a 67 y.o. male who presents via audio/video conferencing for a telehealth visit today.  Pt was last seen in cardiology clinic on 12/31/2021 by Chelsea Aus, PA.  At that time HUTTON BOLGER was doing well.  The patient is now pending procedure as outlined above. Since his last visit, he tells me that he does not have any cardiac related symptoms.  No chest pain or shortness of breath.  He walks on the treadmill 4 times a week for about 30 minutes each time without any symptoms.  He has scored a 5.07 on the DASI.  This exceeds the 4 METS minimum requirement.  Typically, for colonoscopy aspirin does not need to be held and can be continued throughout. However, if  bleeding risk is determined to be too high, can hold aspirin for 5 to 7 days prior to the procedure and resume when medically safe to do so.  Past Medical History    Past Medical History:  Diagnosis Date   Anticoagulated    brilinta and asa-- managed by cardiology   Arthritis    shoulders   CAD in native artery 07/2020   cardiologist--- dr Excell Seltzer;  hx STEMI 08-12-2020 ,  cath w/ PCI and DES to proxLAD, mLCX 50%   Cardiomyopathy, ischemic    06/ 2022  ef 35-45%;   10/ 2022 ef 60-65%  (followed by cardiology)   CKD (chronic kidney disease), stage III (HCC)    CLL (chronic lymphocytic leukemia) (HCC) 2011   oncologist--- dr Candise Che;   no treatment since dx approx 2011   Full dentures    GERD (gastroesophageal reflux disease)    History of gastric ulcer    remote hx   History of ST elevation myocardial infarction (STEMI) 08/12/2020   s/p PCI w/ stent   Hyperlipidemia, mixed    Hypertension    Malignant neoplasm prostate (HCC) 07/2021   urologist--- dr bell/  radiation oncologist--- dr Kathrynn Running;  dx 06/ 2023, Gleason 4+3   Nocturia    S/P drug eluting coronary stent placement 08/12/2020   x1 to prox LAD   Past Surgical History:  Procedure Laterality Date   CORONARY/GRAFT ACUTE MI REVASCULARIZATION N/A 08/12/2020   Procedure: Coronary/Graft Acute MI Revascularization;  Surgeon: Tonny Bollman, MD;  Location: [facility] INVASIVE  CV LAB;  Service: Cardiovascular;  Laterality: N/A;   GOLD SEED IMPLANT N/A 12/29/2021   Procedure: GOLD SEED IMPLANT;  Surgeon: Crista Elliot, MD;  Location: [facility];  Service: Urology;  Laterality: N/A;  30 MINS FOR CASE   LEFT HEART CATH AND CORONARY ANGIOGRAPHY N/A 08/12/2020   Procedure: LEFT HEART CATH AND CORONARY ANGIOGRAPHY;  Surgeon: Tonny Bollman, MD;  Location: [facility] INVASIVE CV LAB;  Service: Cardiovascular;  Laterality: N/A;   SPACE OAR INSTILLATION N/A 12/29/2021   Procedure: SPACE OAR INSTILLATION;  Surgeon: Crista Elliot, MD;   Location: [facility];  Service: Urology;  Laterality: N/A;   TONSILLECTOMY     age 36    Allergies  No Known Allergies  Home Medications    Prior to Admission medications   Medication Sig Start Date End Date Taking? Authorizing Provider  aspirin 81 MG chewable tablet Chew 1 tablet (81 mg total) by mouth daily. 08/15/20   Arty Baumgartner, NP  atorvastatin (LIPITOR) 80 MG tablet TAKE 1 TABLET BY MOUTH DAILY Patient taking differently: Take 80 mg by mouth daily. 11/27/21   Tonny Bollman, MD  carvedilol (COREG) 6.25 MG tablet TAKE 1 TABLET(6.25 MG) BY MOUTH TWICE DAILY Patient taking differently: Take 6.25 mg by mouth. TAKE 1 TABLET(6.25 MG) BY MOUTH TWICE DAILY 09/03/21   Tonny Bollman, MD  ezetimibe (ZETIA) 10 MG tablet TAKE 1 TABLET(10 MG) BY MOUTH DAILY Patient taking differently: Take 10 mg by mouth daily. 10/07/21   Weaver, Scott T, PA-C  FARXIGA 10 MG TABS tablet TAKE 1 TABLET(10 MG) BY MOUTH DAILY Patient taking differently: Take 10 mg by mouth daily. 12/01/21   Bhagat, Sharrell Ku, PA  nitroGLYCERIN (NITROSTAT) 0.4 MG SL tablet Place 1 tablet (0.4 mg total) under the tongue every 5 (five) minutes x 3 doses as needed for chest pain. 06/15/22   Tonny Bollman, MD  ORGOVYX 120 MG TABS Take 1 tablet by mouth daily. 12/22/21   [provider]  tamsulosin (FLOMAX) 0.4 MG CAPS capsule Take 0.4 mg by mouth daily. 12/30/21   [provider]  ZEGERID OTC 20-1100 MG CAPS capsule Take 1 capsule by mouth daily as needed (for indigestion). Patient not taking: Reported on 05/20/2022    [provider]    Physical Exam    Vital Signs:  Walter Chapman does not have vital signs available for review today.  Given telephonic nature of communication, physical exam is limited. AAOx3. NAD. Normal affect.  Speech and respirations are unlabored.  Accessory Clinical Findings    None  Assessment & Plan    1.  Preoperative Cardiovascular Risk  Assessment:  Mr. Stutz perioperative risk of a major cardiac event is 0.9% according to the Revised Cardiac Risk Index (RCRI).  Therefore, he is at low risk for perioperative complications.   His functional capacity is good at 5.07 METs according to the [facility] Activity Status Index (DASI). Recommendations: According to ACC/AHA guidelines, no further cardiovascular testing needed.  The patient may proceed to surgery at acceptable risk.   Antiplatelet and/or Anticoagulation Recommendations: The patient should remain on Aspirin without interruption.    A copy of this note will be routed to requesting surgeon.  Time:   Today, I have spent 5 minutes with the patient with telehealth technology discussing medical history, symptoms, and management plan.     Sharlene Dory, PA-C  06/16/2022, 2:33 PM

## 2022-07-07 ENCOUNTER — Other Ambulatory Visit: Payer: Self-pay | Admitting: Physician Assistant

## 2022-08-18 ENCOUNTER — Other Ambulatory Visit: Payer: Self-pay | Admitting: Cardiovascular Disease

## 2022-08-20 ENCOUNTER — Other Ambulatory Visit: Payer: Self-pay | Admitting: Cardiovascular Disease

## 2022-11-30 ENCOUNTER — Other Ambulatory Visit: Payer: Self-pay

## 2022-11-30 MED ORDER — DAPAGLIFLOZIN PROPANEDIOL 10 MG PO TABS: 10.0000 mg | ORAL_TABLET | Freq: Every day | ORAL | 0 refills | Status: DC

## 2023-02-23 ENCOUNTER — Other Ambulatory Visit: Payer: Self-pay | Admitting: Cardiovascular Disease

## 2023-03-01 ENCOUNTER — Telehealth: Payer: Self-pay | Admitting: Cardiovascular Disease

## 2023-03-01 MED ORDER — DAPAGLIFLOZIN PROPANEDIOL 10 MG PO TABS: 10.0000 mg | ORAL_TABLET | Freq: Every day | ORAL | 0 refills | Status: DC

## 2023-03-01 NOTE — Telephone Encounter (Signed)
*  STAT* If patient is at the pharmacy, call can be transferred to refill team.   1. Which medications need to be refilled? (please list name of each medication and dose if known)   dapagliflozin  propanediol (FARXIGA ) 10 MG TABS tablet     4. Which pharmacy/location (including street and city if local pharmacy) is medication to be sent to? [id number] - [place], [place] - [address]     5. Do they need a 30 day or 90 day supply? 90  Pt's insurance will only pay for 90 day supply

## 2023-03-01 NOTE — Telephone Encounter (Signed)
Pt's medication was sent to pt's pharmacy as requested. Confirmation received.  °

## 2023-03-15 ENCOUNTER — Telehealth (HOSPITAL_COMMUNITY): Payer: Self-pay | Admitting: Cardiovascular Disease

## 2023-03-15 DIAGNOSIS — I10 Essential (primary) hypertension: Secondary | ICD-10-CM

## 2023-03-15 DIAGNOSIS — I2510 Atherosclerotic heart disease of native coronary artery without angina pectoris: Secondary | ICD-10-CM

## 2023-03-15 DIAGNOSIS — E782 Mixed hyperlipidemia: Secondary | ICD-10-CM

## 2023-03-15 DIAGNOSIS — Z0189 Encounter for other specified special examinations: Secondary | ICD-10-CM

## 2023-03-15 DIAGNOSIS — Z79899 Other long term (current) drug therapy: Secondary | ICD-10-CM

## 2023-03-15 NOTE — Telephone Encounter (Signed)
 Yes. CBC, CMET, lipid panel. Thank you   Pt advised.

## 2023-03-15 NOTE — Telephone Encounter (Signed)
 New Message:     Patient wants to know if he needs lab work before his appointment on 03-29-23? If he does, he will need an order pleas for [employer]. He works there.

## 2023-03-22 ENCOUNTER — Other Ambulatory Visit: Payer: Self-pay

## 2023-03-22 ENCOUNTER — Telehealth: Payer: Self-pay

## 2023-03-22 DIAGNOSIS — Z79899 Other long term (current) drug therapy: Secondary | ICD-10-CM

## 2023-03-22 LAB — CBC

## 2023-03-22 NOTE — Telephone Encounter (Signed)
[employer] [facility] called for orders. Labs re-ordered

## 2023-03-23 LAB — COMPREHENSIVE METABOLIC PANEL
ALT: 23 [IU]/L (ref 0–44)
AST: 19 [IU]/L (ref 0–40)
Albumin: 4.2 g/dL (ref 3.9–4.9)
Alkaline Phosphatase: 120 [IU]/L (ref 44–121)
BUN/Creatinine Ratio: 13 (ref 10–24)
BUN: 18 mg/dL (ref 8–27)
Bilirubin Total: 0.6 mg/dL (ref 0.0–1.2)
CO2: 22 mmol/L (ref 20–29)
Calcium: 9.2 mg/dL (ref 8.6–10.2)
Chloride: 107 mmol/L — ABNORMAL HIGH (ref 96–106)
Creatinine, Ser: 1.44 mg/dL — ABNORMAL HIGH (ref 0.76–1.27)
Globulin, Total: 1.6 g/dL (ref 1.5–4.5)
Glucose: 95 mg/dL (ref 70–99)
Potassium: 4.8 mmol/L (ref 3.5–5.2)
Sodium: 141 mmol/L (ref 134–144)
Total Protein: 5.8 g/dL — ABNORMAL LOW (ref 6.0–8.5)
eGFR: 53 mL/min/{1.73_m2} — ABNORMAL LOW (ref >59)

## 2023-03-23 LAB — CBC
Hematocrit: 48.1 % (ref 37.5–51.0)
Hemoglobin: 15.2 g/dL (ref 13.0–17.7)
MCH: 29.6 pg (ref 26.6–33.0)
MCHC: 31.6 g/dL (ref 31.5–35.7)
MCV: 94 fL (ref 79–97)
Platelets: 141 10*3/uL — ABNORMAL LOW (ref 150–450)
RBC: 5.13 x10E6/uL (ref 4.14–5.80)
RDW: 13.6 % (ref 11.6–15.4)
WBC: 11.3 10*3/uL — ABNORMAL HIGH (ref 3.4–10.8)

## 2023-03-23 LAB — LIPID PANEL
Chol/HDL Ratio: 2.2 {ratio} (ref 0.0–5.0)
Cholesterol, Total: 123 mg/dL (ref 100–199)
HDL: 56 mg/dL (ref >39)
LDL Chol Calc (NIH): 51 mg/dL (ref 0–99)
Triglycerides: 79 mg/dL (ref 0–149)
VLDL Cholesterol Cal: 16 mg/dL (ref 5–40)

## 2023-03-29 ENCOUNTER — Encounter: Payer: Self-pay | Admitting: Cardiovascular Disease

## 2023-03-29 ENCOUNTER — Ambulatory Visit: Payer: Managed Care, Other (non HMO) | Attending: Cardiovascular Disease | Admitting: Cardiovascular Disease

## 2023-03-29 VITALS — BP 130/80 | HR 85 | Ht 70.0 in | Wt 251.4 lb

## 2023-03-29 DIAGNOSIS — E782 Mixed hyperlipidemia: Secondary | ICD-10-CM

## 2023-03-29 DIAGNOSIS — I2510 Atherosclerotic heart disease of native coronary artery without angina pectoris: Secondary | ICD-10-CM

## 2023-03-29 DIAGNOSIS — I10 Essential (primary) hypertension: Secondary | ICD-10-CM

## 2023-03-29 DIAGNOSIS — N1831 Chronic kidney disease, stage 3a: Secondary | ICD-10-CM

## 2023-03-29 NOTE — Assessment & Plan Note (Signed)
Blood pressure is controlled on carvedilol.  Continue the same.

## 2023-03-29 NOTE — Patient Instructions (Signed)
Follow-Up: At [facility], you and your health needs are our priority.  As part of our continuing mission to provide you with exceptional heart care, we have created designated Provider Care Teams.  These Care Teams include your primary Cardiologist (physician) and Advanced Practice Providers (APPs -  Physician Assistants and Nurse Practitioners) who all work together to provide you with the care you need, when you need it.  We recommend signing up for the patient portal called "MyChart".  Sign up information is provided on this After Visit Summary.  MyChart is used to connect with patients for Virtual Visits (Telemedicine).  Patients are able to view lab/test results, encounter notes, upcoming appointments, etc.  Non-urgent messages can be sent to your provider as well.   To learn more about what you can do with MyChart, go to [email or web address].    Your next appointment:   As needed  Provider:   Tonny Bollman, MD     Other Instructions

## 2023-03-29 NOTE — Progress Notes (Signed)
Cardiology Office Note:    Date:  03/29/2023   ID:  Walter Chapman, DOB [birth year], MRN [id number]  PCP:  Walter Brunette, MD   [facility] Providers Cardiologist:  Walter Bollman, MD Cardiology APP:  Walter Chapman     Referring MD: Walter Brunette, MD   Chief Complaint  Patient presents with   Coronary Artery Disease    History of Present Illness:    Walter Chapman is a 68 y.o. male with a hx of coronary artery disease, presenting for follow-up evaluation. His CV problems include:  Coronary artery disease  Ant STEMI 6/22 s/p DES to pLAD Ischemic CM Echocardiogram 6/22: EF 35-40  Echocardiogram 10/22: EF 60-65 Hypertension  Hyperlipidemia  Chronic kidney disease Chronic Lymphocytic Leukemia    The patient is here with his wife today.  He is doing very well from a cardiac perspective.  He denies chest pain, chest pressure, or shortness of breath.  He denies leg swelling or heart palpitations.  He walks on a treadmill on a regular basis for exercise with no exertional symptoms.  He states he is able to walk at a steep grade for about 30 minutes with no problems.  He is compliant with his medications.  Current Medications: Current Meds  Medication Sig   aspirin 81 MG chewable tablet Chew 1 tablet (81 mg total) by mouth daily.   atorvastatin (LIPITOR) 80 MG tablet TAKE 1 TABLET BY MOUTH DAILY   carvedilol (COREG) 6.25 MG tablet TAKE 1 TABLET(6.25 MG) BY MOUTH TWICE DAILY   dapagliflozin propanediol (FARXIGA) 10 MG TABS tablet Take 1 tablet (10 mg total) by mouth daily.   ezetimibe (ZETIA) 10 MG tablet TAKE 1 TABLET(10 MG) BY MOUTH DAILY   nitroGLYCERIN (NITROSTAT) 0.4 MG SL tablet Place 1 tablet (0.4 mg total) under the tongue every 5 (five) minutes x 3 doses as needed for chest pain.   tamsulosin (FLOMAX) 0.4 MG CAPS capsule Take 0.4 mg by mouth daily.   [DISCONTINUED] ORGOVYX 120 MG TABS Take 1 tablet by mouth daily.   [DISCONTINUED] ZEGERID OTC 20-1100  MG CAPS capsule Take 1 capsule by mouth daily as needed (for indigestion).     Allergies:   Patient has no known allergies.   ROS:   Please see the history of present illness.    All other systems reviewed and are negative.  EKGs/Labs/Other Studies Reviewed:    The following studies were reviewed today: Cardiac Studies & Procedures   CARDIAC CATHETERIZATION  CARDIAC CATHETERIZATION 08/12/2020  Narrative 1.  Subtotal thrombotic occlusion of the proximal LAD, treated successfully with primary PCI using a 4.0 x 16 mm Synergy DES 2.  Moderate 50% mid left circumflex stenosis with TIMI-3 flow 3.  Widely patent, dominant RCA with no significant obstruction 4.  Elevated LVEDP consistent with acute systolic heart failure in the setting of acute myocardial infarction  Findings Coronary Findings Diagnostic  Dominance: Right  Left Main The vessel exhibits minimal luminal irregularities.  Left Anterior Descending Prox LAD lesion is 99% stenosed. The lesion is heavily thrombotic. TIMI-1 flow  Left Circumflex Mid Cx lesion is 50% stenosed.  Right Coronary Artery Vessel is large. There is mild diffuse disease throughout the vessel. Large, dominant vessel.  The PDA and PLA branches are both large.  There is diffuse plaquing with no significant stenosis present in the RCA distribution.  Intervention  Prox LAD lesion Stent CATH LAUNCHER 6FR EBU3.5 guide catheter was inserted. Lesion crossed with guidewire using a  WIRE COUGAR XT STRL 190CM. Pre-stent angioplasty was performed using a BALLOON SAPPHIRE 2.5X12. A drug-eluting stent was successfully placed using a SYNERGY XD 4.0X16. Post-stent angioplasty was not performed. Post-Intervention Lesion Assessment The intervention was successful. Pre-interventional TIMI flow is 1. Post-intervention TIMI flow is 3. No complications occurred at this lesion. There is a 0% residual stenosis post intervention.    ECHOCARDIOGRAM  ECHOCARDIOGRAM  COMPLETE 12/18/2020  Narrative ECHOCARDIOGRAM REPORT    Patient Name:   Walter Chapman Date of Exam: 12/18/2020 Medical Rec #:  [id number]         Height:       70.0 in Accession #:    [id number]        Weight:       227.2 lb Date of Birth:  [birth year]          BSA:          2.204 m Patient Age:    65 years          BP:           134/87 mmHg Patient Gender: M                 HR:           58 bpm. Exam Location:  [address]  Procedure: 2D Echo and 3D Echo  Indications:    I25.5 Ischemic cardiomyopathy  History:        Patient has prior history of Echocardiogram examinations, most recent 08/13/2020. CAD and Previous Myocardial Infarction; Risk Factors:Hypertension and HLD.  Sonographer:    Clearence Ped RCS Referring Phys: [id number] LAURA R INGOLD  IMPRESSIONS   1. Left ventricular ejection fraction, by estimation, is 60 to 65%. Left ventricular ejection fraction by 3D volume is 61 %. The left ventricle has normal function. The left ventricle has no regional wall motion abnormalities. There is mild left ventricular hypertrophy. Left ventricular diastolic parameters were normal. 2. Right ventricular systolic function is normal. The right ventricular size is normal. There is normal pulmonary artery systolic pressure. The estimated right ventricular systolic pressure is 25.1 mmHg. 3. The mitral valve is grossly normal. Trivial mitral valve regurgitation. 4. The aortic valve is tricuspid. Aortic valve regurgitation is not visualized. 5. The inferior vena cava is dilated in size with >50% respiratory variability, suggesting right atrial pressure of 8 mmHg.  Comparison(s): Changes from prior study are noted. 11/13/2020: LVEF 35-40% with LAD [facility].  FINDINGS Left Ventricle: Left ventricular ejection fraction, by estimation, is 60 to 65%. Left ventricular ejection fraction by 3D volume is 61 %. The left ventricle has normal function. The left ventricle has no regional wall motion  abnormalities. The left ventricular internal cavity size was normal in size. There is mild left ventricular hypertrophy. Left ventricular diastolic parameters were normal.  Right Ventricle: The right ventricular size is normal. No increase in right ventricular wall thickness. Right ventricular systolic function is normal. There is normal pulmonary artery systolic pressure. The tricuspid regurgitant velocity is 2.07 m/s, and with an assumed right atrial pressure of 8 mmHg, the estimated right ventricular systolic pressure is 25.1 mmHg.  Left Atrium: Left atrial size was normal in size.  Right Atrium: Right atrial size was normal in size.  Pericardium: There is no evidence of pericardial effusion.  Mitral Valve: The mitral valve is grossly normal. Trivial mitral valve regurgitation.  Tricuspid Valve: The tricuspid valve is grossly normal. Tricuspid valve regurgitation is trivial.  Aortic Valve: The aortic valve is  tricuspid. Aortic valve regurgitation is not visualized.  Pulmonic Valve: The pulmonic valve was grossly normal. Pulmonic valve regurgitation is not visualized.  Aorta: The aortic root and ascending aorta are structurally normal, with no evidence of dilitation.  Venous: The inferior vena cava is dilated in size with greater than 50% respiratory variability, suggesting right atrial pressure of 8 mmHg.  IAS/Shunts: No atrial level shunt detected by color flow Doppler.   LEFT VENTRICLE PLAX 2D LVIDd:         4.90 cm         Diastology LVIDs:         3.20 cm         LV e' medial:    9.25 cm/s LV PW:         1.10 cm         LV E/e' medial:  6.3 LV IVS:        1.20 cm         LV e' lateral:   12.50 cm/s LVOT diam:     2.00 cm         LV E/e' lateral: 4.7 LV SV:         71 LV SV Index:   32 LVOT Area:     3.14 cm        3D Volume EF LV 3D EF:    Left ventricul ar ejection fraction by 3D volume is 61 %.  3D Volume EF: 3D EF:        61 % LV EDV:       150 ml LV ESV:        59 ml LV SV:        91 ml  RIGHT VENTRICLE RV Basal diam:  3.00 cm RV S prime:     10.40 cm/s TAPSE (M-mode): 1.5 cm RVSP:           20.1 mmHg  LEFT ATRIUM             Index        RIGHT ATRIUM           Index LA diam:        3.80 cm 1.72 cm/m   RA Pressure: 3.00 mmHg LA Vol (A2C):   71.4 ml 32.40 ml/m  RA Area:     7.53 cm LA Vol (A4C):   37.7 ml 17.11 ml/m  RA Volume:   11.90 ml  5.40 ml/m LA Biplane Vol: 52.7 ml 23.92 ml/m AORTIC VALVE LVOT Vmax:   99.80 cm/s LVOT Vmean:  67.800 cm/s LVOT VTI:    0.226 m  AORTA Ao Root diam: 3.30 cm Ao Asc diam:  3.70 cm  MITRAL VALVE               TRICUSPID VALVE MV Area (PHT):             TR Peak grad:   17.1 mmHg MV Decel Time:             TR Vmax:        207.00 cm/s MV E velocity: 58.20 cm/s  Estimated RAP:  3.00 mmHg MV A velocity: 54.00 cm/s  RVSP:           20.1 mmHg MV E/A ratio:  1.08 SHUNTS Systemic VTI:  0.23 m Systemic Diam: 2.00 cm  Zoila Shutter MD Electronically signed by Zoila Shutter MD Signature Date/Time: 12/18/2020/12:16:54 PM    Final  EKG:   EKG Interpretation Date/Time:  Tuesday March 29 2023 15:34:21 EST Ventricular Rate:  85 PR Interval:  178 QRS Duration:  84 QT Interval:  362 QTC Calculation: 430 R Axis:   51  Text Interpretation: Normal sinus rhythm Normal ECG When compared with ECG of 29-Dec-2021 06:07, No significant change was found Confirmed by Walter Chapman [phone]) on 03/29/2023 3:50:41 PM    Recent Labs: 03/22/2023: ALT 23; BUN 18; Creatinine, Ser 1.44; Hemoglobin 15.2; Platelets 141; Potassium 4.8; Sodium 141  Recent Lipid Panel    Component Value Date/Time   CHOL 123 03/22/2023 1044   TRIG 79 03/22/2023 1044   HDL 56 03/22/2023 1044   CHOLHDL 2.2 03/22/2023 1044   CHOLHDL 4.5 08/13/2020 0145   VLDL 31 08/13/2020 0145   LDLCALC 51 03/22/2023 1044     Risk Assessment/Calculations:                Physical Exam:    VS:  BP 130/80   Pulse 85   Ht  5\' 10"  (1.778 m)   Wt 251 lb 6.4 oz (114 kg)   SpO2 95%   BMI 36.07 kg/m     Wt Readings from Last 3 Encounters:  03/29/23 251 lb 6.4 oz (114 kg)  12/31/21 240 lb (108.9 kg)  12/29/21 235 lb 8 oz (106.8 kg)     GEN:  Well nourished, well developed in no acute distress HEENT: Normal NECK: No JVD; No carotid bruits LYMPHATICS: No lymphadenopathy CARDIAC: RRR, no murmurs, rubs, gallops RESPIRATORY:  Clear to auscultation without rales, wheezing or rhonchi  ABDOMEN: Soft, non-tender, non-distended MUSCULOSKELETAL:  No edema; No deformity  SKIN: Warm and dry NEUROLOGIC:  Alert and oriented x 3 PSYCHIATRIC:  Normal affect   Assessment & Plan Coronary artery disease involving native coronary artery of native heart without angina pectoris The patient has no symptoms of angina at a good workload.  He will continue on aspirin for antiplatelet therapy.  I will see him back as needed.  He follows regularly with his primary care physician. Primary hypertension Blood pressure is controlled on carvedilol.  Continue the same. Mixed hyperlipidemia I reviewed his recent labs which show a cholesterol of 123, HDL 56, LDL 51.  ALT is 23.  He will continue on a combination of atorvastatin and ezetimibe. Stage 3a chronic kidney disease (HCC) Patient treated with [place].  Recent creatinine stable at 1.44 which is modestly improved from his past labs over the preceding few years.  The patient appears to be doing very well from a cardiovascular perspective.  All of his risk factors are well-controlled with LDL cholesterol at goal of less than 55, good blood pressure control, stability of his stage IIIa chronic kidney disease, and no anginal symptoms.  He follows with his primary care physician regularly.  I offered him as needed follow-up and I would be happy to see him anytime in the future if he has problems or questions.          Medication Adjustments/Labs and Tests Ordered: Current medicines are  reviewed at length with the patient today.  Concerns regarding medicines are outlined above.  Orders Placed This Encounter  Procedures   EKG 12-Lead   No orders of the defined types were placed in this encounter.   Patient Instructions  Follow-Up: At [facility], you and your health needs are our priority.  As part of our continuing mission to provide you with exceptional heart care, we have created designated Provider  Care Teams.  These Care Teams include your primary Cardiologist (physician) and Advanced Practice Providers (APPs -  Physician Assistants and Nurse Practitioners) who all work together to provide you with the care you need, when you need it.  We recommend signing up for the patient portal called "MyChart".  Sign up information is provided on this After Visit Summary.  MyChart is used to connect with patients for Virtual Visits (Telemedicine).  Patients are able to view lab/test results, encounter notes, upcoming appointments, etc.  Non-urgent messages can be sent to your provider as well.   To learn more about what you can do with MyChart, go to [email or web address].    Your next appointment:   As needed  Provider:   Tonny Bollman, MD     Other Instructions         Signed, Walter Bollman, MD  03/29/2023 5:27 PM     [facility]

## 2023-03-29 NOTE — Assessment & Plan Note (Signed)
The patient has no symptoms of angina at a good workload.  He will continue on aspirin for antiplatelet therapy.  I will see him back as needed.  He follows regularly with his primary care physician.

## 2023-03-29 NOTE — Assessment & Plan Note (Signed)
I reviewed his recent labs which show a cholesterol of 123, HDL 56, LDL 51.  ALT is 23.  He will continue on a combination of atorvastatin and ezetimibe.

## 2023-03-29 NOTE — Assessment & Plan Note (Signed)
Patient treated with [place].  Recent creatinine stable at 1.44 which is modestly improved from his past labs over the preceding few years.

## 2023-05-17 IMAGING — DX DG HIP (WITH OR WITHOUT PELVIS) 2-3V*L*
3 series · 3 of 3 positions shown · non-contrast
Comparison: None.

CLINICAL DATA: Left thigh pain.

EXAM:
DG HIP (WITH OR WITHOUT PELVIS) 2-3V LEFT

[pelvis ap]
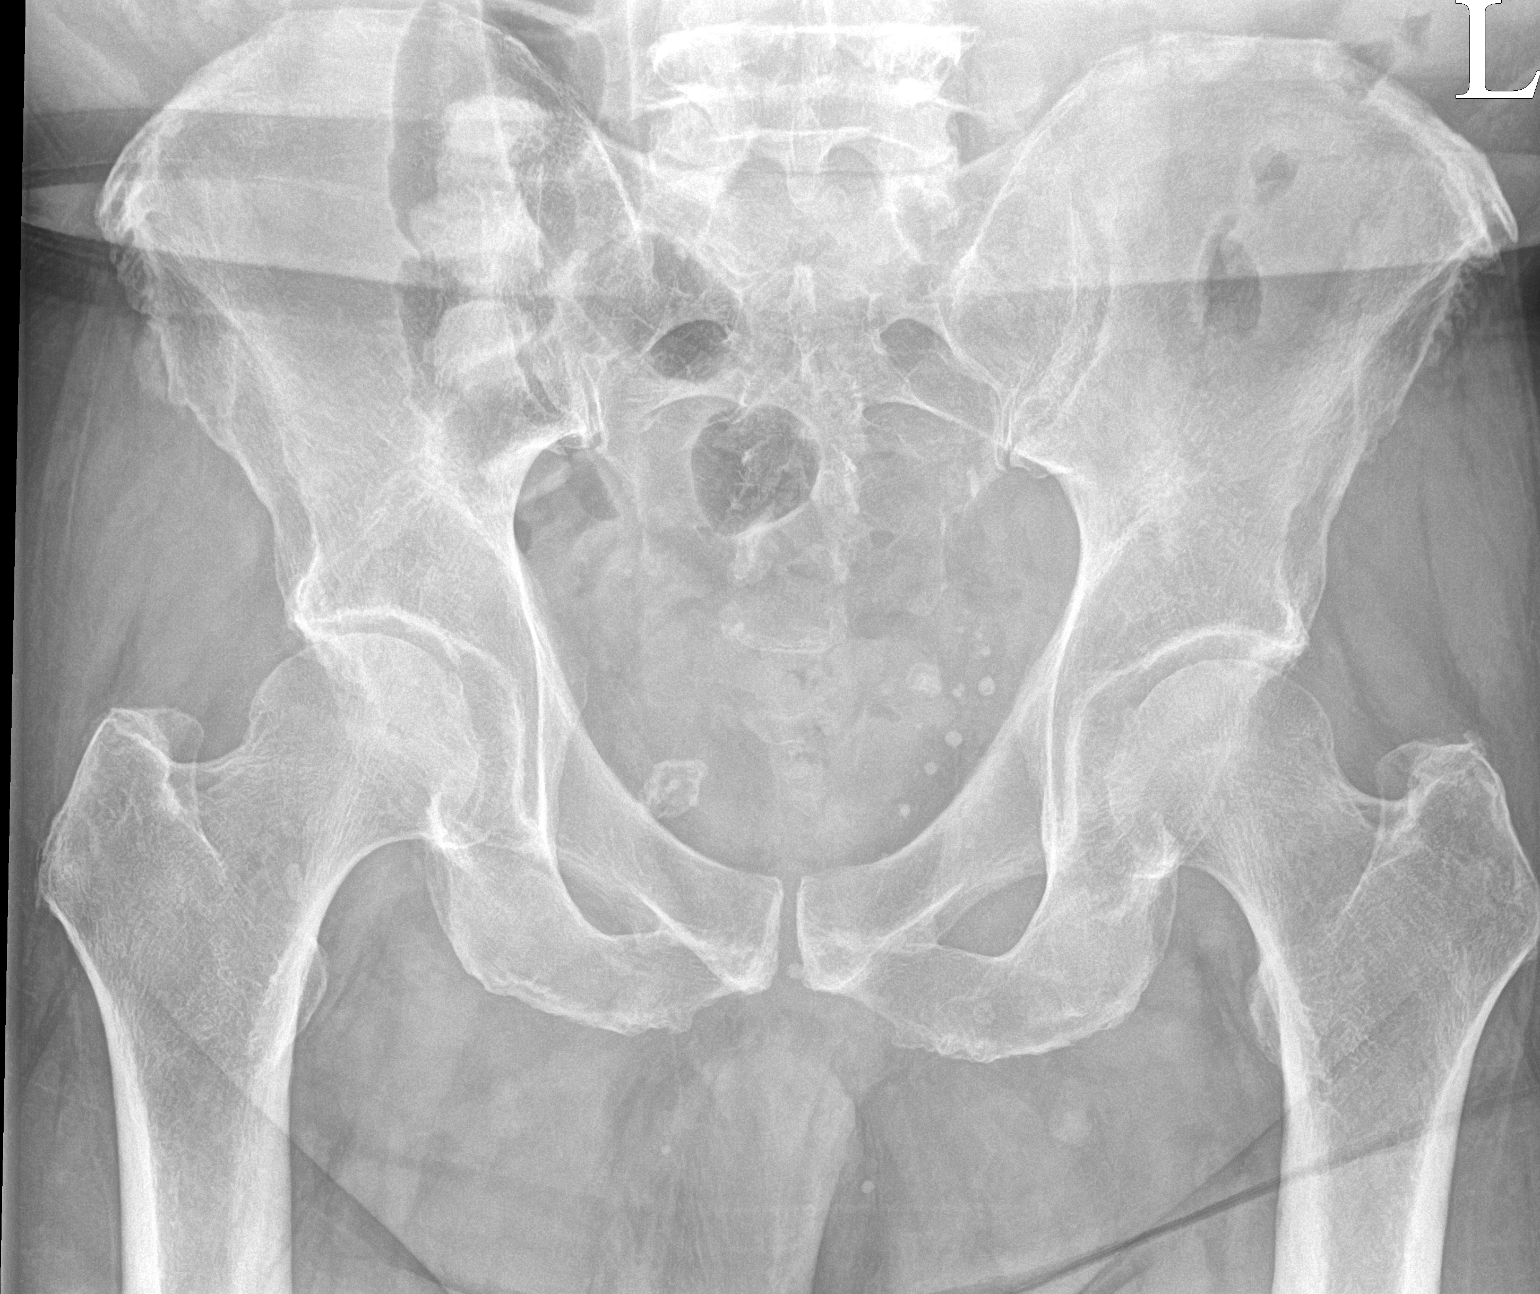

[hip ap]
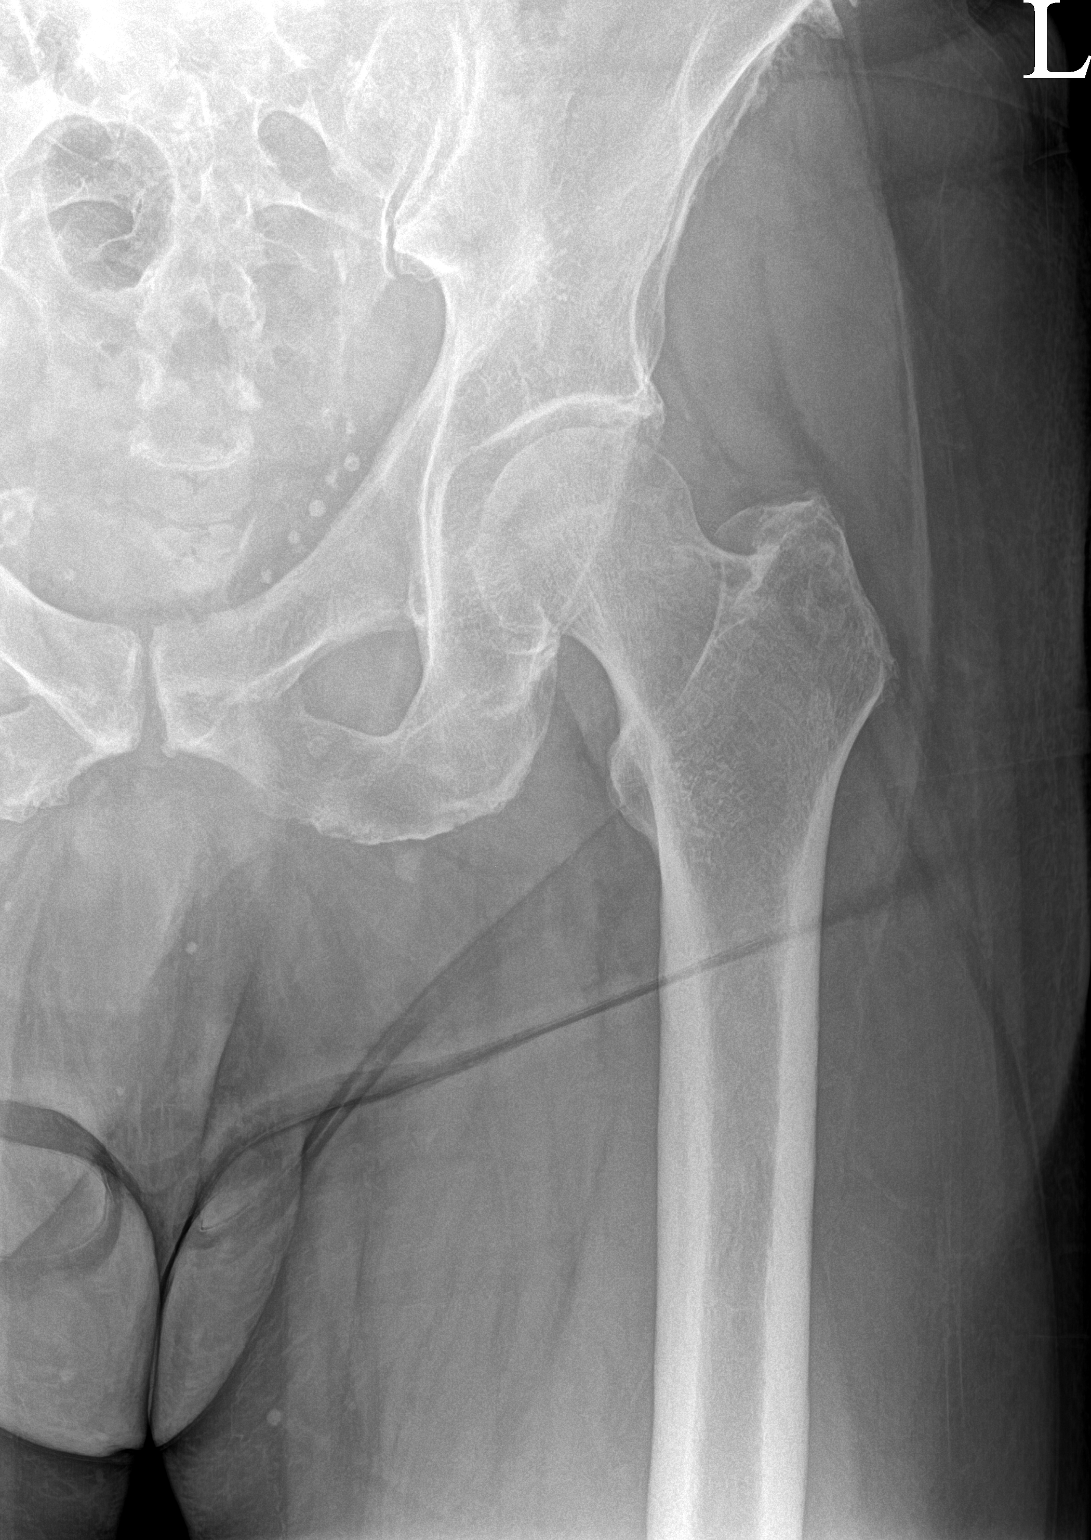

[hip frog leg]
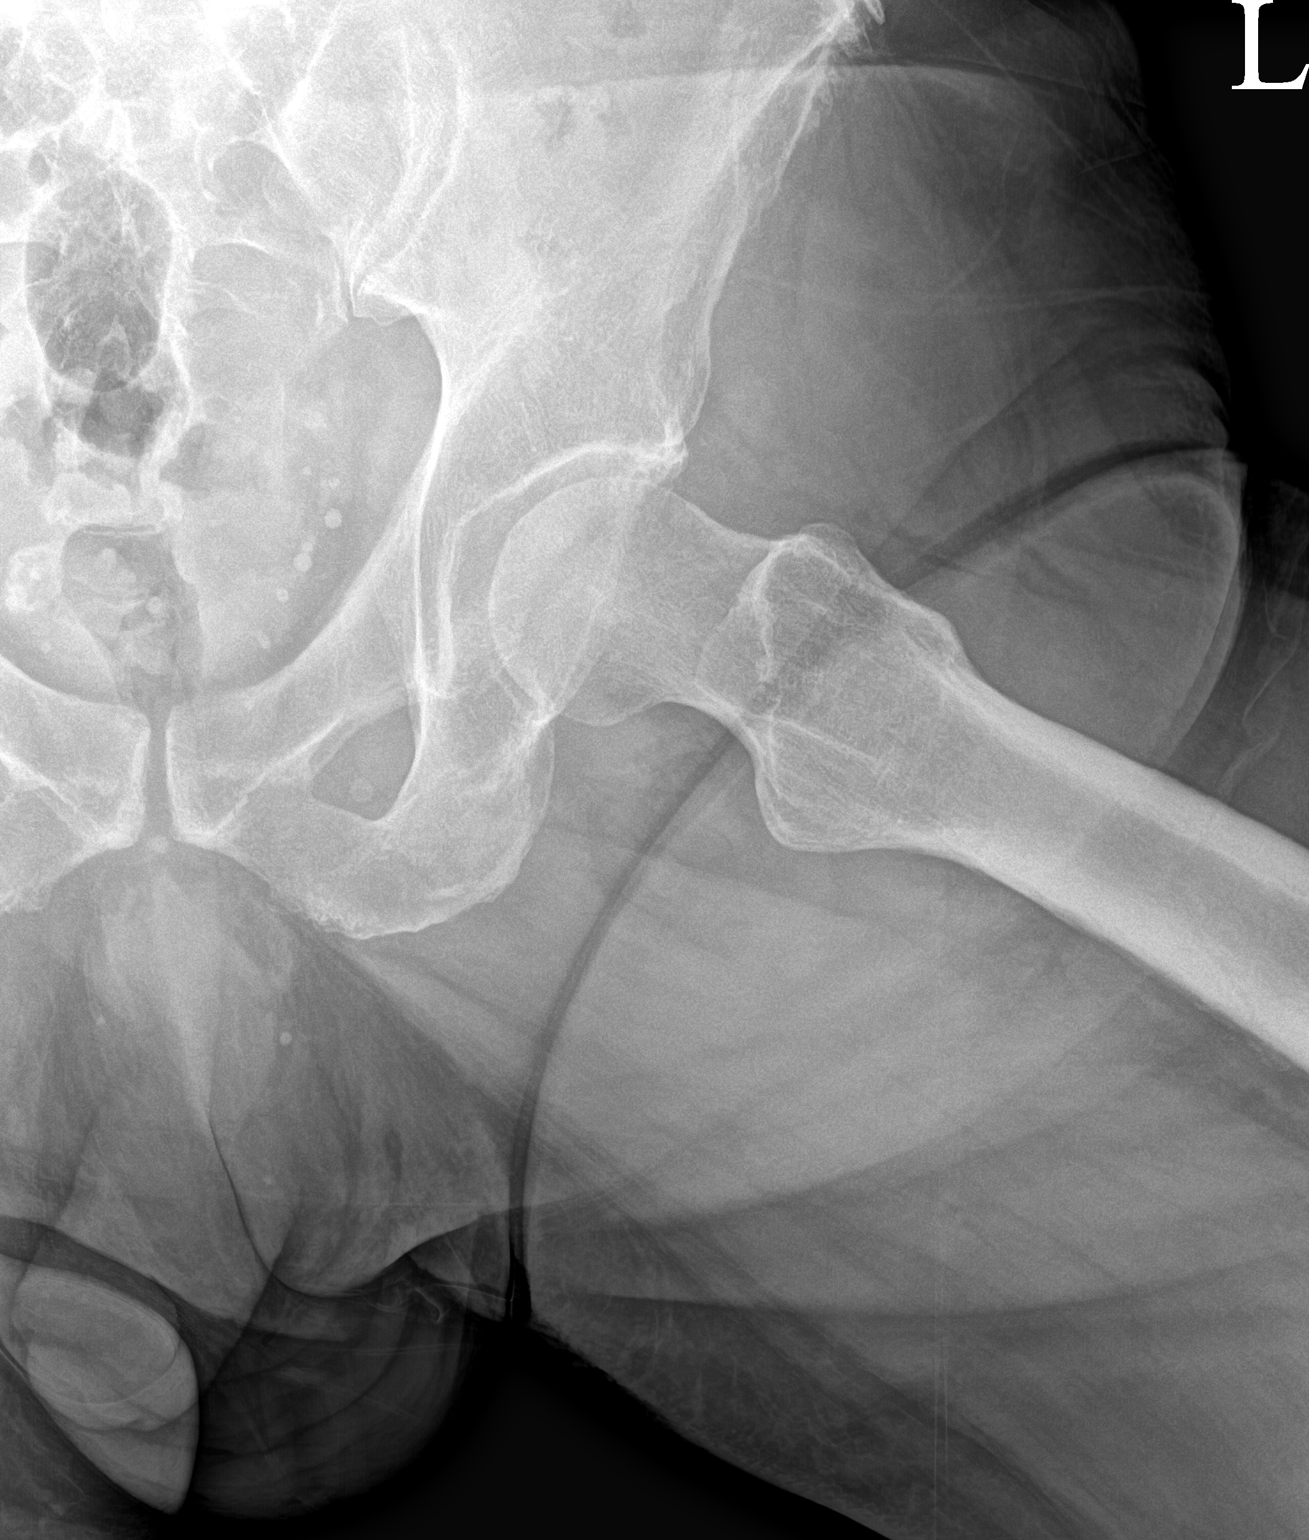

[3 of 3 positions shown; findings below may reference images not displayed]

FINDINGS: There is no evidence of hip fracture or dislocation. There is no
evidence of arthropathy or other focal bone abnormality.
IMPRESSION: Negative.

## 2023-05-17 IMAGING — DX DG LUMBAR SPINE 2-3V
3 series · 3 of 3 positions shown · non-contrast
Comparison: None.

CLINICAL DATA: Low back pain.

EXAM:
LUMBAR SPINE - 2-3 VIEW

[l-spine ap]
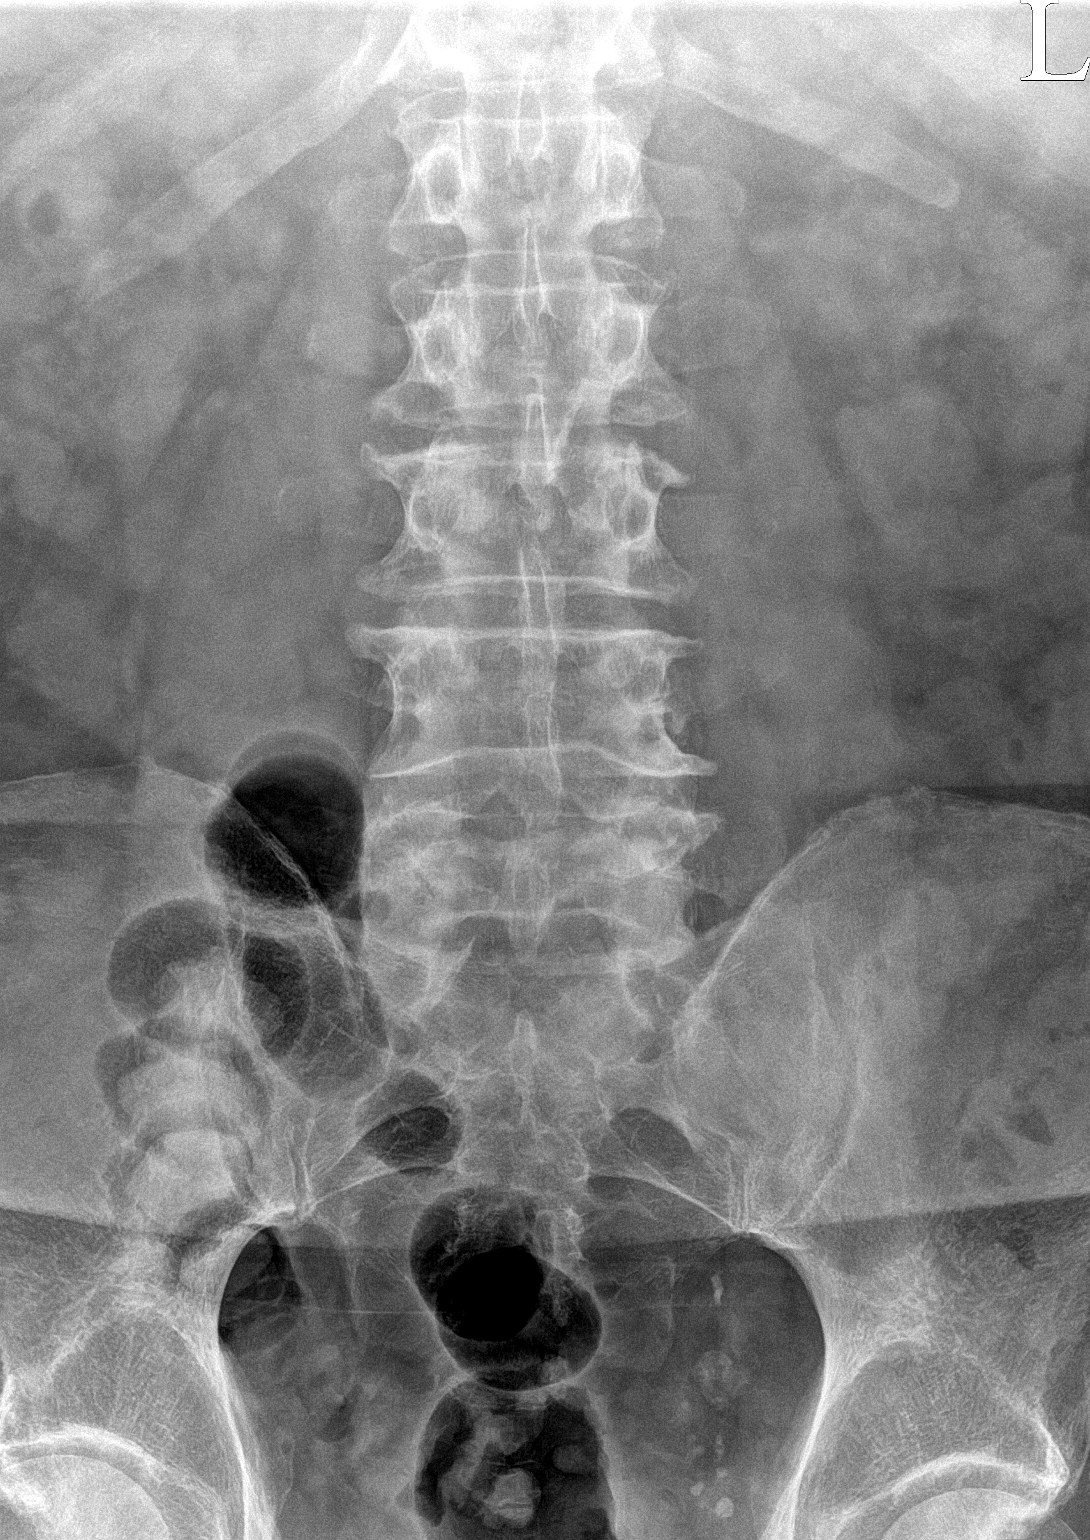

[l-spine lateral]
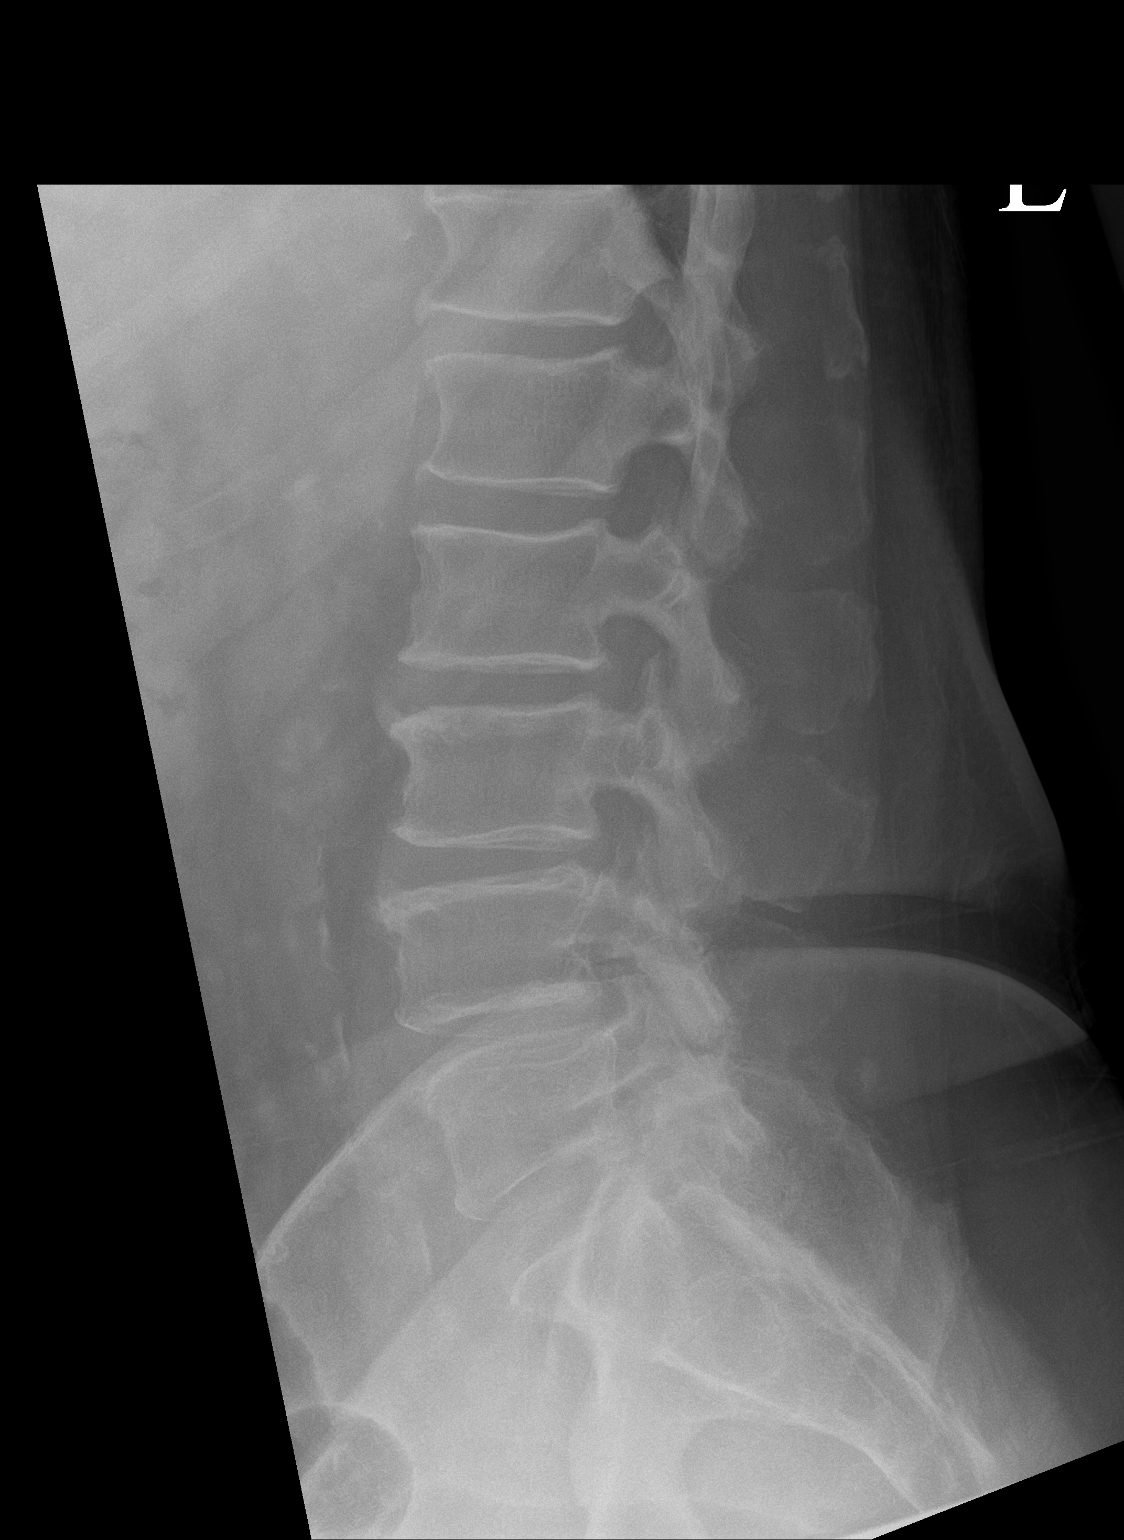

[l-spine spot]
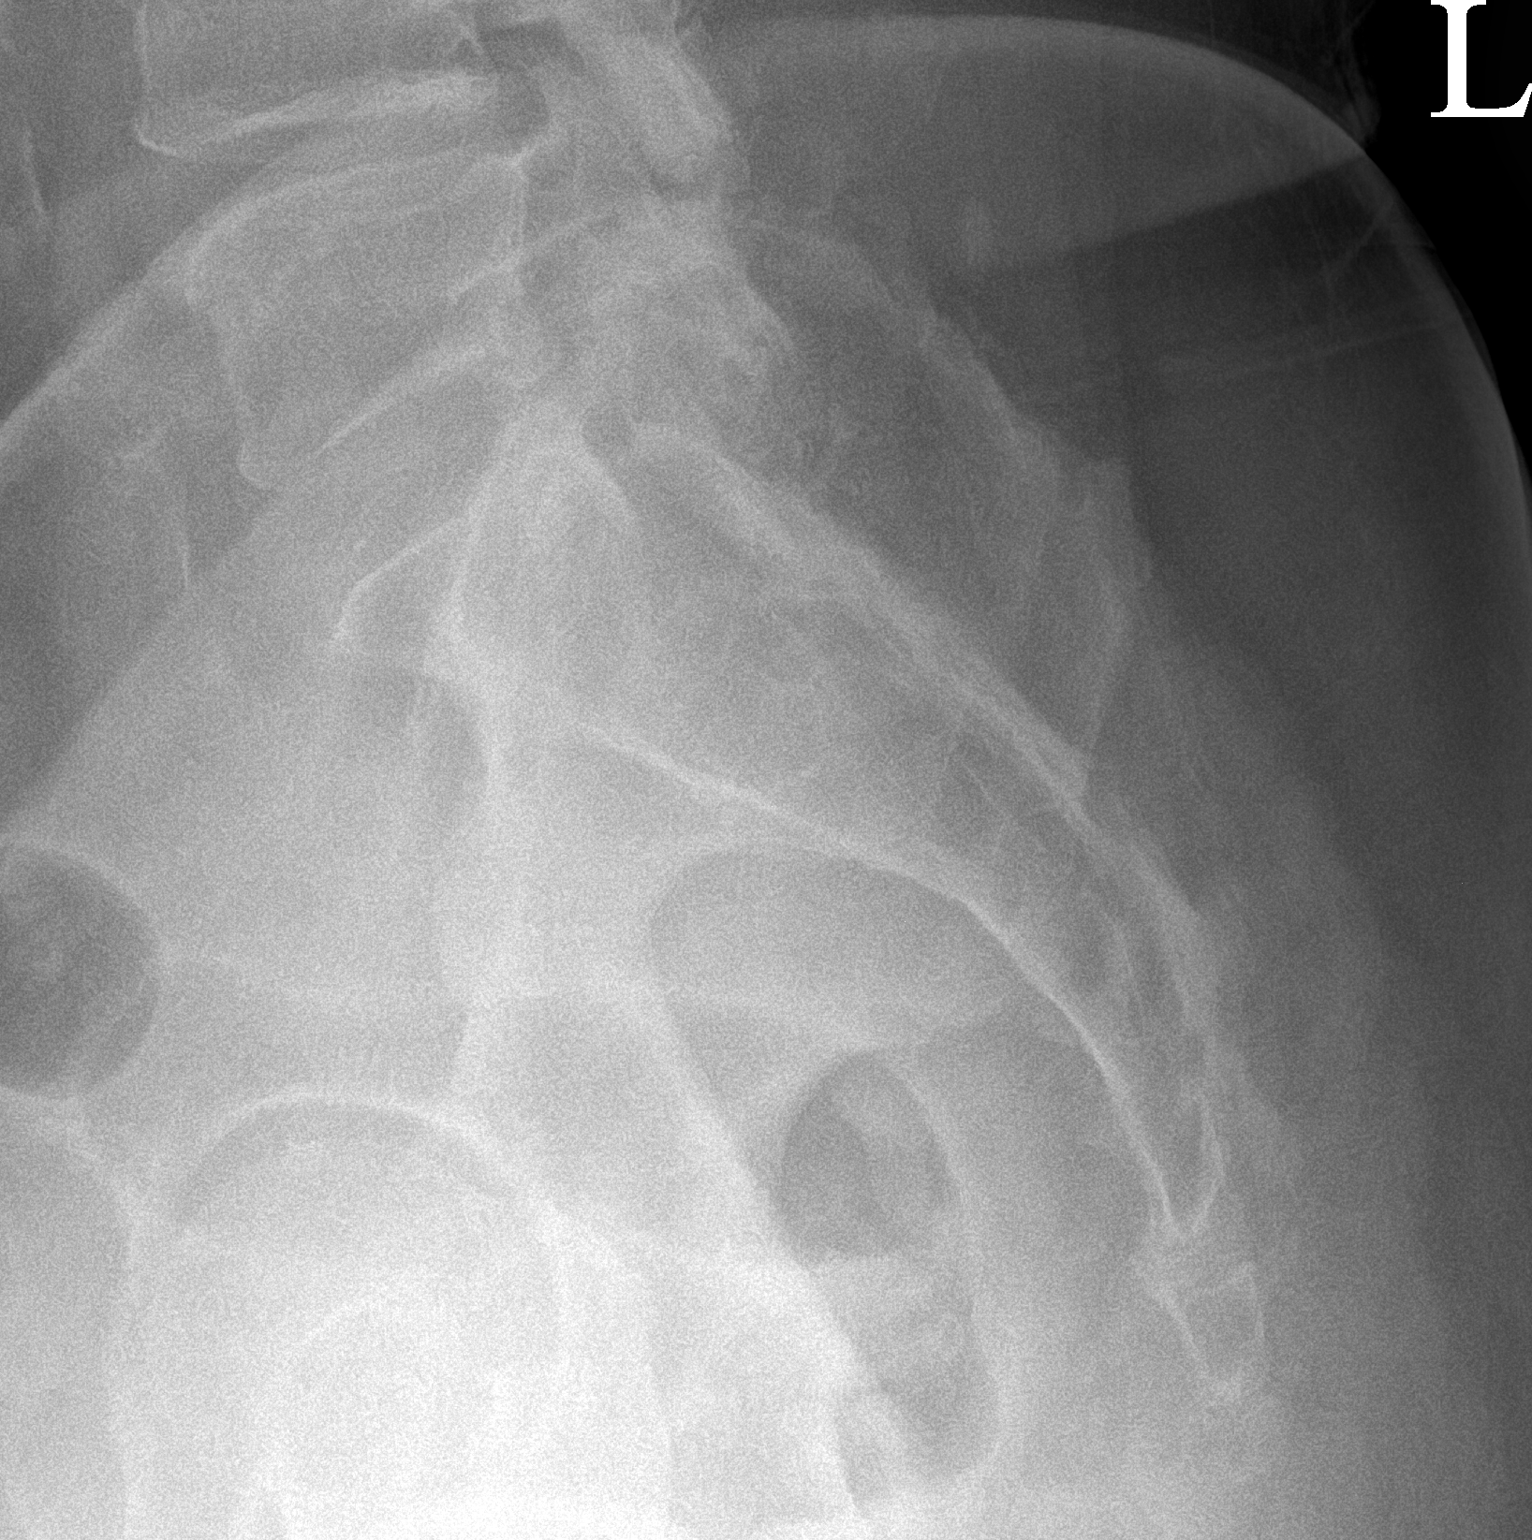

[3 of 3 positions shown; findings below may reference images not displayed]

FINDINGS: There is no evidence of lumbar spine fracture. Alignment is normal.
Intervertebral disc spaces are maintained. There are mild
degenerative endplate osteophytes at L3 and L4. Joints at L5-S1.
There are atherosclerotic calcifications in the abdomen. There are
degenerative changes of facet
IMPRESSION: 1. No acute fracture or malalignment.
2. Mild degenerative changes.

## 2023-05-30 ENCOUNTER — Other Ambulatory Visit: Payer: Self-pay | Admitting: Cardiovascular Disease

## 2023-05-31 ENCOUNTER — Telehealth: Payer: Self-pay | Admitting: Cardiovascular Disease

## 2023-05-31 MED ORDER — DAPAGLIFLOZIN PROPANEDIOL 10 MG PO TABS: 10.0000 mg | ORAL_TABLET | Freq: Every day | ORAL | 2 refills | Status: AC

## 2023-05-31 NOTE — Telephone Encounter (Signed)
 Pt's medication was sent to pt's pharmacy as requested. Confirmation received.

## 2023-05-31 NOTE — Telephone Encounter (Signed)
*  STAT* If patient is at the pharmacy, call can be transferred to refill team.   1. Which medications need to be refilled? (please list name of each medication and dose if known)   dapagliflozin propanediol (FARXIGA) 10 MG TABS tablet    2. Which pharmacy/location (including street and city if local pharmacy) is medication to be sent to?  [facility] [phone] - [place],  - [address]    3. Do they need a 30 day or 90 day supply? 90

## 2023-08-21 ENCOUNTER — Other Ambulatory Visit: Payer: Self-pay | Admitting: Cardiovascular Disease

## 2023-09-21 ENCOUNTER — Other Ambulatory Visit: Payer: Self-pay | Admitting: Physician Assistant

## 2023-09-26 ENCOUNTER — Other Ambulatory Visit: Payer: Self-pay

## 2023-09-26 MED ORDER — EZETIMIBE 10 MG PO TABS: 10.0000 mg | ORAL_TABLET | Freq: Every day | ORAL | 1 refills | Status: AC

## 2023-10-05 LAB — CBC
Hematocrit: 46.8 % (ref 37.5–51.0)
Hemoglobin: 15 g/dL (ref 13.0–17.7)
MCH: 29.9 pg (ref 26.6–33.0)
MCHC: 32.1 g/dL (ref 31.5–35.7)
MCV: 93 fL (ref 79–97)
Platelets: 148 x10E3/uL — ABNORMAL LOW (ref 150–450)
RBC: 5.01 x10E6/uL (ref 4.14–5.80)
RDW: 14.2 % (ref 11.6–15.4)
WBC: 11.4 x10E3/uL — ABNORMAL HIGH (ref 3.4–10.8)

## 2023-10-06 ENCOUNTER — Ambulatory Visit: Payer: Self-pay

## 2023-10-06 LAB — COMPREHENSIVE METABOLIC PANEL WITH GFR
ALT: 21 IU/L (ref 0–44)
AST: 18 IU/L (ref 0–40)
Albumin: 4.2 g/dL (ref 3.9–4.9)
Alkaline Phosphatase: 107 IU/L (ref 44–121)
BUN/Creatinine Ratio: 15 (ref 10–24)
BUN: 20 mg/dL (ref 8–27)
Bilirubin Total: 0.4 mg/dL (ref 0.0–1.2)
CO2: 20 mmol/L (ref 20–29)
Calcium: 8.9 mg/dL (ref 8.6–10.2)
Chloride: 106 mmol/L (ref 96–106)
Creatinine, Ser: 1.32 mg/dL — ABNORMAL HIGH (ref 0.76–1.27)
Globulin, Total: 1.7 g/dL (ref 1.5–4.5)
Glucose: 118 mg/dL — ABNORMAL HIGH (ref 70–99)
Potassium: 4.2 mmol/L (ref 3.5–5.2)
Sodium: 141 mmol/L (ref 134–144)
Total Protein: 5.9 g/dL — ABNORMAL LOW (ref 6.0–8.5)
eGFR: 58 mL/min/1.73 — ABNORMAL LOW (ref >59)
